# Patient Record
Sex: Female | Born: 1945 | Race: White | Hispanic: No | Smoking: Never smoker
Health system: Southern US, Community
[De-identification: ages and names within clinical notes are randomized; demographics above are authoritative.]

## PROBLEM LIST (undated history)

## (undated) DIAGNOSIS — I10 Essential (primary) hypertension: Secondary | ICD-10-CM

## (undated) DIAGNOSIS — M1990 Unspecified osteoarthritis, unspecified site: Secondary | ICD-10-CM

## (undated) HISTORY — PX: ABDOMINAL HYSTERECTOMY: SHX81

## (undated) HISTORY — PX: APPENDECTOMY: SHX54

## (undated) HISTORY — PX: TONSILLECTOMY: SUR1361

---

## 1999-08-16 ENCOUNTER — Ambulatory Visit (HOSPITAL_COMMUNITY): Admission: RE | Admit: 1999-08-16 | Discharge: 1999-08-16 | Payer: Self-pay | Admitting: Gastroenterology

## 1999-08-17 ENCOUNTER — Ambulatory Visit (HOSPITAL_COMMUNITY): Admission: RE | Admit: 1999-08-17 | Discharge: 1999-08-17 | Payer: Self-pay | Admitting: Gastroenterology

## 1999-08-17 ENCOUNTER — Encounter: Payer: Self-pay | Admitting: Gastroenterology

## 2002-11-12 ENCOUNTER — Other Ambulatory Visit: Admission: RE | Admit: 2002-11-12 | Discharge: 2002-11-12 | Payer: Self-pay | Admitting: Family Medicine

## 2009-12-24 HISTORY — PX: JOINT REPLACEMENT: SHX530

## 2010-07-21 ENCOUNTER — Inpatient Hospital Stay (HOSPITAL_COMMUNITY): Admission: RE | Admit: 2010-07-21 | Discharge: 2010-07-24 | Payer: Self-pay | Admitting: Orthopaedic Surgery

## 2011-01-14 ENCOUNTER — Encounter: Payer: Self-pay | Admitting: Family Medicine

## 2011-03-09 LAB — BASIC METABOLIC PANEL
CO2: 26 mEq/L (ref 19–32)
GFR calc non Af Amer: >60 mL/min (ref >60)
Glucose, Bld: 121 mg/dL — ABNORMAL HIGH (ref 70–99)
Potassium: 3.7 mEq/L (ref 3.5–5.1)
Sodium: 139 mEq/L (ref 135–145)

## 2011-03-09 LAB — PROTIME-INR: INR: 1.2 (ref 0.00–1.49)

## 2011-03-09 LAB — CBC
HCT: 28.4 % — ABNORMAL LOW (ref 36.0–46.0)
Hemoglobin: 9.8 g/dL — ABNORMAL LOW (ref 12.0–15.0)
MCH: 32 pg (ref 26.0–34.0)
MCHC: 34.6 g/dL (ref 30.0–36.0)

## 2011-03-10 LAB — BASIC METABOLIC PANEL
BUN: 24 mg/dL — ABNORMAL HIGH (ref 6–23)
CO2: 25 mEq/L (ref 19–32)
CO2: 26 mEq/L (ref 19–32)
CO2: 29 mEq/L (ref 19–32)
Chloride: 104 mEq/L (ref 96–112)
Chloride: 109 mEq/L (ref 96–112)
Creatinine, Ser: 0.8 mg/dL (ref 0.4–1.2)
GFR calc Af Amer: >60 mL/min (ref >60)
GFR calc Af Amer: >60 mL/min (ref >60)
GFR calc non Af Amer: >60 mL/min (ref >60)
Glucose, Bld: 114 mg/dL — ABNORMAL HIGH (ref 70–99)
Glucose, Bld: 142 mg/dL — ABNORMAL HIGH (ref 70–99)
Potassium: 3.9 mEq/L (ref 3.5–5.1)
Sodium: 135 mEq/L (ref 135–145)
Sodium: 139 mEq/L (ref 135–145)

## 2011-03-10 LAB — CBC
HCT: 28.8 % — ABNORMAL LOW (ref 36.0–46.0)
Hemoglobin: 10 g/dL — ABNORMAL LOW (ref 12.0–15.0)
Hemoglobin: 10.7 g/dL — ABNORMAL LOW (ref 12.0–15.0)
MCH: 31.9 pg (ref 26.0–34.0)
MCH: 32.1 pg (ref 26.0–34.0)
MCHC: 34.5 g/dL (ref 30.0–36.0)
MCHC: 34.6 g/dL (ref 30.0–36.0)
MCV: 92.4 fL (ref 78.0–100.0)
MCV: 93.7 fL (ref 78.0–100.0)
Platelets: 289 10*3/uL (ref 150–400)
RBC: 3.08 MIL/uL — ABNORMAL LOW (ref 3.87–5.11)
RBC: 3.32 MIL/uL — ABNORMAL LOW (ref 3.87–5.11)
RDW: 13.1 % (ref 11.5–15.5)
WBC: 11.1 10*3/uL — ABNORMAL HIGH (ref 4.0–10.5)

## 2011-03-10 LAB — PROTIME-INR
INR: 1.26 (ref 0.00–1.49)
Prothrombin Time: 14.1 seconds (ref 11.6–15.2)
Prothrombin Time: 15.7 seconds — ABNORMAL HIGH (ref 11.6–15.2)

## 2011-03-10 LAB — URINALYSIS, ROUTINE W REFLEX MICROSCOPIC
Glucose, UA: NEGATIVE mg/dL
Nitrite: NEGATIVE
Protein, ur: NEGATIVE mg/dL
Urobilinogen, UA: 0.2 mg/dL (ref 0.0–1.0)

## 2011-03-10 LAB — URINE MICROSCOPIC-ADD ON

## 2011-03-10 LAB — ABO/RH: ABO/RH(D): O POS

## 2017-08-12 ENCOUNTER — Ambulatory Visit (INDEPENDENT_AMBULATORY_CARE_PROVIDER_SITE_OTHER): Payer: Medicare Other

## 2017-08-12 ENCOUNTER — Ambulatory Visit (INDEPENDENT_AMBULATORY_CARE_PROVIDER_SITE_OTHER): Payer: Medicare Other | Admitting: Orthopaedic Surgery

## 2017-08-12 DIAGNOSIS — M1611 Unilateral primary osteoarthritis, right hip: Secondary | ICD-10-CM | POA: Diagnosis not present

## 2017-08-12 DIAGNOSIS — M25551 Pain in right hip: Secondary | ICD-10-CM | POA: Diagnosis not present

## 2017-08-12 NOTE — Progress Notes (Signed)
Office Visit Note   Patient: Lisa [name]           Date of Birth: May 17, 1946           MRN: [id number] Visit Date: 08/12/2017              Requested by: No referring provider defined for this encounter. PCP: Patient, No Pcp Per   Assessment & Plan: Visit Diagnoses:  1. Pain in right hip   2. Unilateral primary osteoarthritis, right hip     Plan: Given the severity of her right hip disease and the detrimental effects this is had on her life, her quality of living, her mobility and due to daily pain she would like to proceed with a right total hip arthroplasty. We went over x-rays in detail and had a long and thorough discussion about hip replacement surgery. Having had this successfully on her left side she does wish proceed in the near future on her right hip and I agree with this. All questions were encouraged and answered.  Follow-Up Instructions: Return for 2 weeks post-op.   Orders:  Orders Placed This Encounter  Procedures  . XR HIP UNILAT W OR W/O PELVIS 1V RIGHT   No orders of the defined types were placed in this encounter.     Procedures: No procedures performed   Clinical Data: No additional findings.   Subjective: No chief complaint on file. The patient is listed as new patient but have actually seen her before. I performed a left total hip arthroplasty on her in 2011. She has done well with that hip and has had no issues with that at all. She has developed right hip pain over the last year with pain in her groin. Is been hurting significantly to her. It is detrimentally affected her activities daily living, her quality of life, and her mobility. She's not been able to take a long exercise walks as he used to. She has tried a cane to offload her hip and anti-inflammatories and this conservative treatment has failed.  HPI  Review of Systems She denies any headache, chest pain, short of breath, fever, chills, nausea, vomiting.  Objective: Vital Signs:  There were no vitals taken for this visit.  Physical Exam She is alert 3 and in no acute distress Ortho Exam Examination of her left hip has fluid range of motion with good internal/external rotation with no blocks to this at all. Her right hip shows significant pain and some limitations with internal and external rotation. Her leg lengths are equal. Specialty Comments:  No specialty comments available.  Imaging: Xr Hip Unilat W Or W/o Pelvis 1v Right  Result Date: 08/12/2017 An AP pelvis and lateral of her right hip show severe end-stage arthritis of the right hip. This was compared to films from 8 years ago. She has loss of the superior lateral joint space. There are sclerotic changes as well as cystic changes. There is significant joint space narrowing in multiple areas. There is periarticular osteophytes as well. She does have a left total hip arthroplasty that appears well seated with no evidence of osteolysis or, getting features.    PMFS History: Patient Active Problem List   Diagnosis Date Noted  . Unilateral primary osteoarthritis, right hip 08/12/2017   No past medical history on file.  No family history on file.  No past surgical history on file. Social History   Occupational History  . Not on file.   Social History Main Topics  .  Smoking status: Not on file  . Smokeless tobacco: Not on file  . Alcohol use Not on file  . Drug use: Unknown  . Sexual activity: Not on file

## 2017-09-18 NOTE — Progress Notes (Signed)
Please place orders in EPIC as patient has a pre-op appointment on 09/23/2017! Thank you! 

## 2017-09-19 ENCOUNTER — Other Ambulatory Visit (INDEPENDENT_AMBULATORY_CARE_PROVIDER_SITE_OTHER): Payer: Self-pay | Admitting: Orthopaedic Surgery

## 2017-09-19 ENCOUNTER — Other Ambulatory Visit (INDEPENDENT_AMBULATORY_CARE_PROVIDER_SITE_OTHER): Payer: Self-pay | Admitting: Physician Assistant

## 2017-09-19 NOTE — Progress Notes (Signed)
Please place orders in EPIC as patient has a pre-op appointment on 09/23/2017! Thank you! 

## 2017-09-20 NOTE — Patient Instructions (Addendum)
Lisa [name]  09/20/2017   Your procedure is scheduled on: 10-04-17  Report to [facility] Main  Entrance Take [place]  [facility] to 3rd floor to  Short Stay Center at 5:15 AM.    Call this number if you have problems the morning of surgery  [phone]   Remember: ONLY 1 PERSON MAY GO WITH YOU TO SHORT STAY TO GET  READY MORNING OF YOUR SURGERY.  Do not eat food or drink liquids :After Midnight.     Take these medicines the morning of surgery with A SIP OF WATER: None                               You may not have any metal on your body including hair pins and              piercings  Do not wear jewelry, make-up, lotions, powders or perfumes, deodorant             Do not wear nail polish.  Do not shave  48 hours prior to surgery.                 Do not bring valuables to the hospital. Lisa [name] IS NOT             RESPONSIBLE   FOR VALUABLES.  Contacts, dentures or bridgework may not be worn into surgery.  Leave suitcase in the car. After surgery it may be brought to your room.                  Please read over the following fact sheets you were given: _____________________________________________________________________             [facility] - Preparing for Surgery Before surgery, you can play an important role.  Because skin is not sterile, your skin needs to be as free of germs as possible.  You can reduce the number of germs on your skin by washing with CHG (chlorahexidine gluconate) soap before surgery.  CHG is an antiseptic cleaner which kills germs and bonds with the skin to continue killing germs even after washing. Please DO NOT use if you have an allergy to CHG or antibacterial soaps.  If your skin becomes reddened/irritated stop using the CHG and inform your nurse when you arrive at Short Stay. Do not shave (including legs and underarms) for at least 48 hours prior to the first CHG shower.  You may shave your face/neck. Please follow these  instructions carefully:  1.  Shower with CHG Soap the night before surgery and the  morning of Surgery.  2.  If you choose to wash your hair, wash your hair first as usual with your  normal  shampoo.  3.  After you shampoo, rinse your hair and body thoroughly to remove the  shampoo.                           4.  Use CHG as you would any other liquid soap.  You can apply chg directly  to the skin and wash                       Gently with a scrungie or clean washcloth.  5.  Apply the CHG Soap to your body  ONLY FROM THE NECK DOWN.   Do not use on face/ open                           Wound or open sores. Avoid contact with eyes, ears mouth and genitals (private parts).                       Wash face,  Genitals (private parts) with your normal soap.             6.  Wash thoroughly, paying special attention to the area where your surgery  will be performed.  7.  Thoroughly rinse your body with warm water from the neck down.  8.  DO NOT shower/wash with your normal soap after using and rinsing off  the CHG Soap.                9.  Pat yourself dry with a clean towel.            10.  Wear clean pajamas.            11.  Place clean sheets on your bed the night of your first shower and do not  sleep with pets. Day of Surgery : Do not apply any lotions/deodorants the morning of surgery.  Please wear clean clothes to the hospital/surgery center.  FAILURE TO FOLLOW THESE INSTRUCTIONS MAY RESULT IN THE CANCELLATION OF YOUR SURGERY PATIENT SIGNATURE_________________________________  NURSE SIGNATURE__________________________________  ________________________________________________________________________   Lisa [name]  An incentive spirometer is a tool that can help keep your lungs clear and active. This tool measures how well you are filling your lungs with each breath. Taking long deep breaths may help reverse or decrease the chance of developing breathing (pulmonary) problems (especially  infection) following:  A long period of time when you are unable to move or be active. BEFORE THE PROCEDURE   If the spirometer includes an indicator to show your best effort, your nurse or respiratory therapist will set it to a desired goal.  If possible, sit up straight or lean slightly forward. Try not to slouch.  Hold the incentive spirometer in an upright position. INSTRUCTIONS FOR USE  1. Sit on the edge of your bed if possible, or sit up as far as you can in bed or on a chair. 2. Hold the incentive spirometer in an upright position. 3. Breathe out normally. 4. Place the mouthpiece in your mouth and seal your lips tightly around it. 5. Breathe in slowly and as deeply as possible, raising the piston or the ball toward the top of the column. 6. Hold your breath for 3-5 seconds or for as long as possible. Allow the piston or ball to fall to the bottom of the column. 7. Remove the mouthpiece from your mouth and breathe out normally. 8. Rest for a few seconds and repeat Steps 1 through 7 at least 10 times every 1-2 hours when you are awake. Take your time and take a few normal breaths between deep breaths. 9. The spirometer may include an indicator to show your best effort. Use the indicator as a goal to work toward during each repetition. 10. After each set of 10 deep breaths, practice coughing to be sure your lungs are clear. If you have an incision (the cut made at the time of surgery), support your incision when coughing by placing a pillow or rolled up towels firmly against it. Once  you are able to get out of bed, walk around indoors and cough well. You may stop using the incentive spirometer when instructed by your caregiver.  RISKS AND COMPLICATIONS  Take your time so you do not get dizzy or light-headed.  If you are in pain, you may need to take or ask for pain medication before doing incentive spirometry. It is harder to take a deep breath if you are having pain. AFTER  USE  Rest and breathe slowly and easily.  It can be helpful to keep track of a log of your progress. Your caregiver can provide you with a simple table to help with this. If you are using the spirometer at home, follow these instructions: [employer] IF:   You are having difficultly using the spirometer.  You have trouble using the spirometer as often as instructed.  Your pain medication is not giving enough relief while using the spirometer.  You develop fever of 100.5 F (38.1 C) or higher. SEEK IMMEDIATE MEDICAL CARE IF:   You cough up bloody sputum that had not been present before.  You develop fever of 102 F (38.9 C) or greater.  You develop worsening pain at or near the incision site. MAKE SURE YOU:   Understand these instructions.  Will watch your condition.  Will get help right away if you are not doing well or get worse. Document Released: 04/22/2007 Document Revised: 03/03/2012 Document Reviewed: 06/23/2007 ExitCare Patient Information 2014 Exit[employer], [place].   ________________________________________________________________________  WHAT IS A BLOOD TRANSFUSION? Blood Transfusion Information  A transfusion is the replacement of blood or some of its parts. Blood is made up of multiple cells which provide different functions.  Red blood cells carry oxygen and are used for blood loss replacement.  White blood cells fight against infection.  Platelets control bleeding.  Plasma helps clot blood.  Other blood products are available for specialized needs, such as hemophilia or other clotting disorders. BEFORE THE TRANSFUSION  Who gives blood for transfusions?   Healthy volunteers who are fully evaluated to make sure their blood is safe. This is blood bank blood. Transfusion therapy is the safest it has ever been in the practice of medicine. Before blood is taken from a donor, a complete history is taken to make sure that person has no history of diseases  nor engages in risky social behavior (examples are intravenous drug use or sexual activity with multiple partners). The donor's travel history is screened to minimize risk of transmitting infections, such as malaria. The donated blood is tested for signs of infectious diseases, such as HIV and hepatitis. The blood is then tested to be sure it is compatible with you in order to minimize the chance of a transfusion reaction. If you or a relative donates blood, this is often done in anticipation of surgery and is not appropriate for emergency situations. It takes many days to process the donated blood. RISKS AND COMPLICATIONS Although transfusion therapy is very safe and saves many lives, the main dangers of transfusion include:   Getting an infectious disease.  Developing a transfusion reaction. This is an allergic reaction to something in the blood you were given. Every precaution is taken to prevent this. The decision to have a blood transfusion has been considered carefully by your caregiver before blood is given. Blood is not given unless the benefits outweigh the risks. AFTER THE TRANSFUSION  Right after receiving a blood transfusion, you will usually feel much better and more energetic. This  is especially true if your red blood cells have gotten low (anemic). The transfusion raises the level of the red blood cells which carry oxygen, and this usually causes an energy increase.  The nurse administering the transfusion will monitor you carefully for complications. HOME CARE INSTRUCTIONS  No special instructions are needed after a transfusion. You may find your energy is better. Speak with your caregiver about any limitations on activity for underlying diseases you may have. SEEK MEDICAL CARE IF:   Your condition is not improving after your transfusion.  You develop redness or irritation at the intravenous (IV) site. SEEK IMMEDIATE MEDICAL CARE IF:  Any of the following symptoms occur over the  next 12 hours:  Shaking chills.  You have a temperature by mouth above 102 F (38.9 C), not controlled by medicine.  Chest, back, or muscle pain.  People around you feel you are not acting correctly or are confused.  Shortness of breath or difficulty breathing.  Dizziness and fainting.  You get a rash or develop hives.  You have a decrease in urine output.  Your urine turns a dark color or changes to pink, red, or brown. Any of the following symptoms occur over the next 10 days:  You have a temperature by mouth above 102 F (38.9 C), not controlled by medicine.  Shortness of breath.  Weakness after normal activity.  The white part of the eye turns yellow (jaundice).  You have a decrease in the amount of urine or are urinating less often.  Your urine turns a dark color or changes to pink, red, or brown. Document Released: 12/07/2000 Document Revised: 03/03/2012 Document Reviewed: 07/26/2008 [facility] Patient Information 2014 [place], [place].  _______________________________________________________________________

## 2017-09-23 ENCOUNTER — Encounter (HOSPITAL_COMMUNITY)
Admission: RE | Admit: 2017-09-23 | Discharge: 2017-09-23 | Disposition: A | Discharge status: Home / Self Care | Payer: Medicare Other | Source: Ambulatory Visit | Attending: Orthopaedic Surgery | Admitting: Orthopaedic Surgery

## 2017-09-23 ENCOUNTER — Encounter (HOSPITAL_COMMUNITY): Payer: Self-pay

## 2017-09-23 ENCOUNTER — Encounter (INDEPENDENT_AMBULATORY_CARE_PROVIDER_SITE_OTHER): Payer: Self-pay

## 2017-09-23 DIAGNOSIS — Z01810 Encounter for preprocedural cardiovascular examination: Secondary | ICD-10-CM | POA: Diagnosis present

## 2017-09-23 DIAGNOSIS — Z01812 Encounter for preprocedural laboratory examination: Secondary | ICD-10-CM | POA: Diagnosis present

## 2017-09-23 HISTORY — DX: Unspecified osteoarthritis, unspecified site: M19.90

## 2017-09-23 HISTORY — DX: Essential (primary) hypertension: I10

## 2017-09-23 LAB — BASIC METABOLIC PANEL
ANION GAP: 9 (ref 5–15)
BUN: 18 mg/dL (ref 6–20)
CHLORIDE: 104 mmol/L (ref 101–111)
CO2: 27 mmol/L (ref 22–32)
CREATININE: 0.78 mg/dL (ref 0.44–1.00)
Calcium: 9.7 mg/dL (ref 8.9–10.3)
GFR calc non Af Amer: >60 mL/min (ref >60)
Glucose, Bld: 99 mg/dL (ref 65–99)
Potassium: 4.6 mmol/L (ref 3.5–5.1)
Sodium: 140 mmol/L (ref 135–145)

## 2017-09-23 LAB — CBC
HEMATOCRIT: 38 % (ref 36.0–46.0)
Hemoglobin: 12.6 g/dL (ref 12.0–15.0)
MCH: 30.2 pg (ref 26.0–34.0)
MCHC: 33.2 g/dL (ref 30.0–36.0)
MCV: 91.1 fL (ref 78.0–100.0)
Platelets: 338 10*3/uL (ref 150–400)
RBC: 4.17 MIL/uL (ref 3.87–5.11)
RDW: 12.6 % (ref 11.5–15.5)
WBC: 7.1 10*3/uL (ref 4.0–10.5)

## 2017-09-23 LAB — SURGICAL PCR SCREEN
MRSA, PCR: NEGATIVE
Staphylococcus aureus: NEGATIVE

## 2017-09-23 LAB — TYPE AND SCREEN
ABO/RH(D): O POS
ANTIBODY SCREEN: NEGATIVE

## 2017-10-03 NOTE — Anesthesia Preprocedure Evaluation (Addendum)
Anesthesia Evaluation  Patient identified by MRN, date of birth, ID band Patient awake    Reviewed: Allergy & Precautions, NPO status , Patient's Chart, lab work & pertinent test results  Airway Mallampati: II  TM Distance: >3 FB Neck ROM: Full    Dental  (+) Dental Advisory Given, Partial Upper, Missing   Pulmonary neg pulmonary ROS,    Pulmonary exam normal breath sounds clear to auscultation       Cardiovascular hypertension, Normal cardiovascular exam Rhythm:Regular Rate:Normal     Neuro/Psych negative neurological ROS  negative psych ROS   GI/Hepatic negative GI ROS, Neg liver ROS,   Endo/Other  negative endocrine ROS  Renal/GU negative Renal ROS  negative genitourinary   Musculoskeletal  (+) Arthritis , Osteoarthritis,    Abdominal   Peds  Hematology negative hematology ROS (+)   Anesthesia Other Findings   Reproductive/Obstetrics                            Anesthesia Physical Anesthesia Plan  ASA: II  Anesthesia Plan: Spinal   Post-op Pain Management:    Induction:   PONV Risk Score and Plan: Treatment may vary due to age or medical condition and Propofol infusion  Airway Management Planned: Natural Airway and Nasal Cannula  Additional Equipment: None  Intra-op Plan:   Post-operative Plan:   Informed Consent: I have reviewed the patients History and Physical, chart, labs and discussed the procedure including the risks, benefits and alternatives for the proposed anesthesia with the patient or authorized representative who has indicated his/her understanding and acceptance.   Dental advisory given  Plan Discussed with: CRNA  Anesthesia Plan Comments:         Anesthesia Quick Evaluation

## 2017-10-04 ENCOUNTER — Encounter (HOSPITAL_COMMUNITY)
Admission: RE | Disposition: A | Discharge status: Home Health | Payer: Self-pay | Source: Ambulatory Visit | Attending: Orthopaedic Surgery

## 2017-10-04 ENCOUNTER — Inpatient Hospital Stay (HOSPITAL_COMMUNITY): Payer: Medicare Other

## 2017-10-04 ENCOUNTER — Encounter (HOSPITAL_COMMUNITY): Payer: Self-pay

## 2017-10-04 ENCOUNTER — Inpatient Hospital Stay (HOSPITAL_COMMUNITY): Payer: Medicare Other | Admitting: Anesthesiology

## 2017-10-04 ENCOUNTER — Inpatient Hospital Stay (HOSPITAL_COMMUNITY)
Admission: RE | Admit: 2017-10-04 | Discharge: 2017-10-07 | DRG: 470 | Disposition: A | Discharge status: Home Health | Payer: Medicare Other | Source: Ambulatory Visit | Attending: Orthopaedic Surgery | Admitting: Orthopaedic Surgery

## 2017-10-04 DIAGNOSIS — Z7982 Long term (current) use of aspirin: Secondary | ICD-10-CM

## 2017-10-04 DIAGNOSIS — M1611 Unilateral primary osteoarthritis, right hip: Secondary | ICD-10-CM | POA: Diagnosis present

## 2017-10-04 DIAGNOSIS — Z8679 Personal history of other diseases of the circulatory system: Secondary | ICD-10-CM | POA: Diagnosis not present

## 2017-10-04 DIAGNOSIS — Z96642 Presence of left artificial hip joint: Secondary | ICD-10-CM | POA: Diagnosis present

## 2017-10-04 DIAGNOSIS — Z419 Encounter for procedure for purposes other than remedying health state, unspecified: Secondary | ICD-10-CM

## 2017-10-04 DIAGNOSIS — Z96641 Presence of right artificial hip joint: Secondary | ICD-10-CM

## 2017-10-04 HISTORY — PX: TOTAL HIP ARTHROPLASTY: SHX124

## 2017-10-04 SURGERY — ARTHROPLASTY, HIP, TOTAL, ANTERIOR APPROACH
Anesthesia: Spinal | Site: Hip | Laterality: Right

## 2017-10-04 MED ORDER — CEFAZOLIN SODIUM-DEXTROSE 2-4 GM/100ML-% IV SOLN
2.0000 g | INTRAVENOUS | Status: AC
  Administered 2017-10-04: 2 g via INTRAVENOUS

## 2017-10-04 MED ORDER — ASPIRIN 81 MG PO CHEW
81.0000 mg | CHEWABLE_TABLET | Freq: Two times a day (BID) | ORAL | Status: DC
  Administered 2017-10-05 – 2017-10-07 (×5): 81 mg via ORAL
  Filled 2017-10-04 (×5): qty 1

## 2017-10-04 MED ORDER — METHOCARBAMOL 500 MG PO TABS
500.0000 mg | ORAL_TABLET | Freq: Four times a day (QID) | ORAL | Status: DC | PRN
  Administered 2017-10-05: 500 mg via ORAL
  Filled 2017-10-04: qty 1

## 2017-10-04 MED ORDER — CEFAZOLIN SODIUM-DEXTROSE 1-4 GM/50ML-% IV SOLN
1.0000 g | Freq: Four times a day (QID) | INTRAVENOUS | Status: AC
  Administered 2017-10-04 (×2): 1 g via INTRAVENOUS
  Filled 2017-10-04 (×2): qty 50

## 2017-10-04 MED ORDER — PHENOL 1.4 % MT LIQD
1.0000 | OROMUCOSAL | Status: DC | PRN
  Filled 2017-10-04: qty 177

## 2017-10-04 MED ORDER — OXYCODONE HCL 5 MG PO TABS
5.0000 mg | ORAL_TABLET | ORAL | Status: DC | PRN
  Administered 2017-10-04: 5 mg via ORAL
  Administered 2017-10-04 – 2017-10-07 (×9): 10 mg via ORAL
  Filled 2017-10-04 (×9): qty 2
  Filled 2017-10-04: qty 1
  Filled 2017-10-04: qty 2

## 2017-10-04 MED ORDER — METOCLOPRAMIDE HCL 5 MG/ML IJ SOLN
5.0000 mg | Freq: Three times a day (TID) | INTRAMUSCULAR | Status: DC | PRN
  Administered 2017-10-04 (×2): 10 mg via INTRAVENOUS
  Filled 2017-10-04 (×2): qty 2

## 2017-10-04 MED ORDER — ONDANSETRON HCL 4 MG/2ML IJ SOLN
INTRAMUSCULAR | Status: AC
  Filled 2017-10-04: qty 2

## 2017-10-04 MED ORDER — ACETAMINOPHEN 325 MG PO TABS
650.0000 mg | ORAL_TABLET | Freq: Four times a day (QID) | ORAL | Status: DC | PRN
  Administered 2017-10-04: 650 mg via ORAL
  Filled 2017-10-04: qty 2

## 2017-10-04 MED ORDER — PROPOFOL 10 MG/ML IV BOLUS
INTRAVENOUS | Status: AC
  Filled 2017-10-04: qty 60

## 2017-10-04 MED ORDER — ACETAMINOPHEN 650 MG RE SUPP: 650.0000 mg | Freq: Four times a day (QID) | RECTAL | Status: DC | PRN

## 2017-10-04 MED ORDER — ZINC SULFATE 220 (50 ZN) MG PO CAPS
220.0000 mg | ORAL_CAPSULE | Freq: Every day | ORAL | Status: DC
  Administered 2017-10-05 – 2017-10-07 (×3): 220 mg via ORAL
  Filled 2017-10-04 (×4): qty 1

## 2017-10-04 MED ORDER — METHOCARBAMOL 1000 MG/10ML IJ SOLN
500.0000 mg | Freq: Four times a day (QID) | INTRAVENOUS | Status: DC | PRN
  Administered 2017-10-04: 500 mg via INTRAVENOUS
  Filled 2017-10-04: qty 550

## 2017-10-04 MED ORDER — MAGNESIUM OXIDE 400 (241.3 MG) MG PO TABS
400.0000 mg | ORAL_TABLET | Freq: Every day | ORAL | Status: DC
  Administered 2017-10-05 – 2017-10-06 (×2): 400 mg via ORAL
  Filled 2017-10-04 (×2): qty 1

## 2017-10-04 MED ORDER — CEFAZOLIN SODIUM-DEXTROSE 2-4 GM/100ML-% IV SOLN
INTRAVENOUS | Status: AC
  Filled 2017-10-04: qty 100

## 2017-10-04 MED ORDER — FENTANYL CITRATE (PF) 100 MCG/2ML IJ SOLN: 25.0000 ug | INTRAMUSCULAR | Status: DC | PRN

## 2017-10-04 MED ORDER — ALUM & MAG HYDROXIDE-SIMETH 200-200-20 MG/5ML PO SUSP: 30.0000 mL | ORAL | Status: DC | PRN

## 2017-10-04 MED ORDER — BUPIVACAINE IN DEXTROSE 0.75-8.25 % IT SOLN
INTRATHECAL | Status: DC | PRN
  Administered 2017-10-04: 1.6 mL via INTRATHECAL

## 2017-10-04 MED ORDER — VITAMIN D3 25 MCG (1000 UNIT) PO TABS
1000.0000 [IU] | ORAL_TABLET | Freq: Every day | ORAL | Status: DC
  Administered 2017-10-05 – 2017-10-07 (×3): 1000 [IU] via ORAL
  Filled 2017-10-04 (×3): qty 1

## 2017-10-04 MED ORDER — PROPOFOL 10 MG/ML IV BOLUS
INTRAVENOUS | Status: AC
  Filled 2017-10-04: qty 20

## 2017-10-04 MED ORDER — TRANEXAMIC ACID 1000 MG/10ML IV SOLN
1000.0000 mg | INTRAVENOUS | Status: AC
  Administered 2017-10-04: 1000 mg via INTRAVENOUS
  Filled 2017-10-04: qty 1100

## 2017-10-04 MED ORDER — POLYETHYLENE GLYCOL 3350 17 G PO PACK: 17.0000 g | PACK | Freq: Every day | ORAL | Status: DC | PRN

## 2017-10-04 MED ORDER — HYDROMORPHONE HCL-NACL 0.5-0.9 MG/ML-% IV SOSY
1.0000 mg | PREFILLED_SYRINGE | INTRAVENOUS | Status: DC | PRN
  Administered 2017-10-04 (×2): 1 mg via INTRAVENOUS
  Filled 2017-10-04 (×2): qty 2

## 2017-10-04 MED ORDER — 0.9 % SODIUM CHLORIDE (POUR BTL) OPTIME
TOPICAL | Status: DC | PRN
Start: 2017-10-04 — End: 2017-10-04
  Administered 2017-10-04: 1000 mL

## 2017-10-04 MED ORDER — SODIUM CHLORIDE 0.9 % IV SOLN
INTRAVENOUS | Status: DC
  Administered 2017-10-04 (×2): via INTRAVENOUS

## 2017-10-04 MED ORDER — VITAMIN A 15000 UNITS PO TABS: 1500.0000 [IU] | ORAL_TABLET | Freq: Every day | ORAL | Status: DC

## 2017-10-04 MED ORDER — PROPOFOL 500 MG/50ML IV EMUL
INTRAVENOUS | Status: DC | PRN
  Administered 2017-10-04: 75 ug/kg/min via INTRAVENOUS

## 2017-10-04 MED ORDER — METOCLOPRAMIDE HCL 5 MG PO TABS: 5.0000 mg | ORAL_TABLET | Freq: Three times a day (TID) | ORAL | Status: DC | PRN

## 2017-10-04 MED ORDER — PROPOFOL 10 MG/ML IV BOLUS
INTRAVENOUS | Status: DC | PRN
  Administered 2017-10-04: 30 mg via INTRAVENOUS

## 2017-10-04 MED ORDER — SODIUM CHLORIDE 0.9 % IR SOLN
Status: DC | PRN
  Administered 2017-10-04: 1000 mL

## 2017-10-04 MED ORDER — CHLORHEXIDINE GLUCONATE 4 % EX LIQD: 60.0000 mL | Freq: Once | CUTANEOUS | Status: DC

## 2017-10-04 MED ORDER — DIPHENHYDRAMINE HCL 12.5 MG/5ML PO ELIX: 12.5000 mg | ORAL_SOLUTION | ORAL | Status: DC | PRN

## 2017-10-04 MED ORDER — DOCUSATE SODIUM 100 MG PO CAPS
100.0000 mg | ORAL_CAPSULE | Freq: Two times a day (BID) | ORAL | Status: DC
  Administered 2017-10-05 – 2017-10-07 (×5): 100 mg via ORAL
  Filled 2017-10-04 (×5): qty 1

## 2017-10-04 MED ORDER — ONDANSETRON HCL 4 MG/2ML IJ SOLN: 4.0000 mg | Freq: Once | INTRAMUSCULAR | Status: DC | PRN

## 2017-10-04 MED ORDER — ONDANSETRON HCL 4 MG PO TABS: 4.0000 mg | ORAL_TABLET | Freq: Four times a day (QID) | ORAL | Status: DC | PRN

## 2017-10-04 MED ORDER — PROPOFOL 10 MG/ML IV BOLUS
INTRAVENOUS | Status: AC
Start: 2017-10-04 — End: 2017-10-04
  Filled 2017-10-04: qty 60

## 2017-10-04 MED ORDER — LACTATED RINGERS IV SOLN
INTRAVENOUS | Status: DC
  Administered 2017-10-04: 1000 mL via INTRAVENOUS

## 2017-10-04 MED ORDER — STERILE WATER FOR IRRIGATION IR SOLN
Status: DC | PRN
Start: 2017-10-04 — End: 2017-10-04
  Administered 2017-10-04: 2000 mL

## 2017-10-04 MED ORDER — ONDANSETRON HCL 4 MG/2ML IJ SOLN
4.0000 mg | Freq: Four times a day (QID) | INTRAMUSCULAR | Status: DC | PRN
  Administered 2017-10-04: 4 mg via INTRAVENOUS
  Filled 2017-10-04: qty 2

## 2017-10-04 MED ORDER — VITAMIN E 180 MG (400 UNIT) PO CAPS
400.0000 [IU] | ORAL_CAPSULE | Freq: Every day | ORAL | Status: DC
  Administered 2017-10-05 – 2017-10-07 (×3): 400 [IU] via ORAL
  Filled 2017-10-04 (×4): qty 1

## 2017-10-04 MED ORDER — DEXAMETHASONE SODIUM PHOSPHATE 10 MG/ML IJ SOLN
INTRAMUSCULAR | Status: AC
  Filled 2017-10-04: qty 1

## 2017-10-04 MED ORDER — MENTHOL 3 MG MT LOZG: 1.0000 | LOZENGE | OROMUCOSAL | Status: DC | PRN

## 2017-10-04 SURGICAL SUPPLY — 34 items
BAG ZIPLOCK 12X15 (MISCELLANEOUS) IMPLANT
BENZOIN TINCTURE PRP APPL 2/3 (GAUZE/BANDAGES/DRESSINGS) ×2 IMPLANT
BLADE SAW SGTL 18X1.27X75 (BLADE) ×2 IMPLANT
CAPT HIP TOTAL 2 ×2 IMPLANT
CELLS DAT CNTRL 66122 CELL SVR (MISCELLANEOUS) ×1 IMPLANT
COVER PERINEAL POST (MISCELLANEOUS) ×2 IMPLANT
COVER SURGICAL LIGHT HANDLE (MISCELLANEOUS) ×2 IMPLANT
DRAPE STERI IOBAN 125X83 (DRAPES) ×2 IMPLANT
DRAPE U-SHAPE 47X51 STRL (DRAPES) ×4 IMPLANT
DRSG AQUACEL AG ADV 3.5X10 (GAUZE/BANDAGES/DRESSINGS) ×2 IMPLANT
DURAPREP 26ML APPLICATOR (WOUND CARE) ×2 IMPLANT
ELECT REM PT RETURN 15FT ADLT (MISCELLANEOUS) ×2 IMPLANT
GAUZE XEROFORM 1X8 LF (GAUZE/BANDAGES/DRESSINGS) IMPLANT
GLOVE BIO SURGEON STRL SZ7.5 (GLOVE) ×2 IMPLANT
GLOVE BIOGEL PI IND STRL 8 (GLOVE) ×2 IMPLANT
GLOVE BIOGEL PI INDICATOR 8 (GLOVE) ×2
GLOVE ECLIPSE 8.0 STRL XLNG CF (GLOVE) ×2 IMPLANT
GOWN STRL REUS W/TWL XL LVL3 (GOWN DISPOSABLE) ×4 IMPLANT
HANDPIECE INTERPULSE COAX TIP (DISPOSABLE) ×1
HOLDER FOLEY CATH W/STRAP (MISCELLANEOUS) ×2 IMPLANT
PACK ANTERIOR HIP CUSTOM (KITS) ×2 IMPLANT
RTRCTR WOUND ALEXIS 18CM MED (MISCELLANEOUS) ×2
SET HNDPC FAN SPRY TIP SCT (DISPOSABLE) ×1 IMPLANT
STAPLER VISISTAT 35W (STAPLE) IMPLANT
STRIP CLOSURE SKIN 1/2X4 (GAUZE/BANDAGES/DRESSINGS) IMPLANT
SUT ETHIBOND NAB CT1 #1 30IN (SUTURE) ×2 IMPLANT
SUT MNCRL AB 4-0 PS2 18 (SUTURE) IMPLANT
SUT VIC AB 0 CT1 36 (SUTURE) ×2 IMPLANT
SUT VIC AB 1 CT1 36 (SUTURE) ×2 IMPLANT
SUT VIC AB 2-0 CT1 27 (SUTURE) ×2
SUT VIC AB 2-0 CT1 TAPERPNT 27 (SUTURE) ×2 IMPLANT
TAPE STRIPS DRAPE STRL (GAUZE/BANDAGES/DRESSINGS) ×2 IMPLANT
TRAY FOLEY W/METER SILVER 16FR (SET/KITS/TRAYS/PACK) ×2 IMPLANT
YANKAUER SUCT BULB TIP 10FT TU (MISCELLANEOUS) ×2 IMPLANT

## 2017-10-04 NOTE — Op Note (Signed)
NAME:  Lisa [name], Lisa [name]                      ACCOUNT NO.:  MEDICAL RECORD NO.:  [id number]  LOCATION:                                 FACILITY:  PHYSICIAN:  Vanita Panda. Magnus Ivan, M.D.DATE OF BIRTH:  DATE OF PROCEDURE:  10/04/2017 DATE OF DISCHARGE:                              OPERATIVE REPORT   PREOPERATIVE DIAGNOSES:  Primary osteoarthritis and degenerative joint disease, right hip.  POSTOPERATIVE DIAGNOSES:  Primary osteoarthritis and degenerative joint disease, right hip.  PROCEDURE:  Right total hip arthroplasty through direct anterior approach.  IMPLANTS:  DePuy Sector Gription acetabular component size 54, size 36 +4 neutral polyethylene liner, size 14 Corail femoral component with standard offset, size 36 -2 metal hip ball.  SURGEON:  Vanita Panda. Magnus Ivan, MD.  ASSISTANT:  Richardean Canal, PA-C.  ANESTHESIA:  Spinal.  BLOOD LOSS:  200 mL.  ANTIBIOTICS:  IV Ancef 2 g.  COMPLICATIONS:  None.  INDICATIONS:  Lisa [name] is a very pleasant 71 year old female, well known to me.  She has debilitating arthritis of her right hip.  She actually had a successful left total hip arthroplasty about 8 years ago, and now her pain has become debilitating on right side.  She has x-ray evidence of severe end-stage arthritis as well.  At this point, she does wish to proceed with a total hip arthroplasty.  She understands our goals are to decrease pain, improve mobility, and overall improved quality of life.  She understands the risks of acute blood loss anemia, nerve and vessel injury, fracture, infection, dislocation, DVT.  PROCEDURE DESCRIPTION:  After informed consent was obtained, appropriate right hip was marked.  She was brought to the operating room, where spinal anesthesia was obtained while she was on her stretcher.  A Foley catheter was placed and she was laid in a supine position on the stretcher.  Traction boots were placed on both of her feet.  Next, she was placed  supine on the Hana fracture table with the perineal post in place and both legs in InLine skeletal traction devices, but no traction applied.  Her right operative hip was prepped and draped with DuraPrep and sterile drapes.  Time-out was called, and she was identified as correct patient and correct right hip.  We then made an incision just inferior and posterior to the anterior superior iliac spine and carried this obliquely down the leg.  We dissected down tensor fascia lata muscle.  The tensor fascia was then divided longitudinally to proceed with a direct anterior approach to the hip.  We identified and cauterized circumflex vessels and identified the hip capsule.  I opened up the hip capsule in an L-type format, finding a large joint effusion and significant arthritic disease in the hip joint itself.  We placed Cobra retractors around the medial and lateral femoral neck and then made our femoral neck cut with an oscillating saw proximal to the lesser trochanter and completed this on osteotome.  I placed a corkscrew guide in the femoral head and removed the femoral head in its entirety and found it to be devoid of cartilage.  We then cleaned the acetabulum and remnants of the acetabular  labrum.  I placed a bent Hohmann over the medial acetabular rim.  I then began reaming under direct visualization from a size 42 reamer in stepwise increments up to a size 54 with all reamers under direct visualization and last reamer under direct fluoroscopy, so we could obtain our depth of reaming, our inclination, and anteversion.  Once we were pleased with this, we placed the real DePuy Sector Gription acetabular component size 54 and a 36 +4 neutral polyethylene liner.  We then externally rotated the leg to 120 degrees extended and abducted it to gain access to the femur.  We placed a Mueller retractor medially and a Hohmann retractor behind the greater trochanter.  We used a box cutting osteotome  to enter femoral canal and a rongeur lateralized and then began broaching from a size 8 broach using the Corail broaching system going up to a size 14.  With a 14 in place, we trialed a standard offset femoral neck with a 36 -2 hip ball due to positioning the cup and based on the preoperative alignment.  She did start shoulder on that side preoperatively.  We reduced the acetabulum.  I reduced the trial implants in the acetabulum.  We were pleased with range of motion, offset, and stability.  We then dislocated the hip and removed the trial components.  We were able to place the real Corail femoral component size 14 with standard offset and real 36 - 2 metal hip ball.  We reduced this in the acetabulum and again, we were pleased with stability.  We then irrigated the soft tissue with normal saline solution using pulsatile lavage.  We closed the joint capsule with interrupted #1 Ethibond suture, followed by running #1 Vicryl in the tensor fascia, 0 Vicryl in the deep tissue, 2-0 Vicryl in the subcutaneous tissue, 4-0 Monocryl subcuticular stitch, and Steri-Strips on the skin.  An Aquacel dressing was applied.  She was taken off the Hana table to the recovery room in stable condition.  All final counts were correct.  There were no complications noted.  Of note, Richardean Canal, PA-C, assisted in the entire case.  His assistance was crucial for facilitating all aspects of this case.     Vanita Panda. Magnus Ivan, M.D.     CYB/MEDQ  D:  10/04/2017  T:  10/04/2017  Job:  [id number]

## 2017-10-04 NOTE — Anesthesia Procedure Notes (Signed)
Spinal  Patient location during procedure: OR Start time: 10/04/2017 7:32 AM End time: 10/04/2017 7:38 AM Reason for block: at surgeon's request Staffing Resident/CRNA: Anne Fu Performed: resident/CRNA  Preanesthetic Checklist Completed: patient identified, site marked, surgical consent, pre-op evaluation, timeout performed, IV checked, risks and benefits discussed and monitors and equipment checked Spinal Block Patient position: sitting Prep: DuraPrep Patient monitoring: heart rate, continuous pulse ox and blood pressure Approach: right paramedian Location: L2-3 Injection technique: single-shot Needle Needle type: Pencan  Needle gauge: 24 G Needle length: 9 cm Assessment Sensory level: T6 Additional Notes Expiration date of kit checked and confirmed. Patient tolerated procedure well, without complications. X 1 attempt with noted clear CSF return. Loss of motor and sensory on exam post injection.

## 2017-10-04 NOTE — Brief Op Note (Signed)
10/04/2017  8:55 AM  PATIENT:  Lisa [name]  71 y.o. female  PRE-OPERATIVE DIAGNOSIS:  severe osteoarthritis right hip  POST-OPERATIVE DIAGNOSIS:  severe osteoarthritis right hip  PROCEDURE:  Procedure(s): RIGHT TOTAL HIP ARTHROPLASTY ANTERIOR APPROACH (Right)  SURGEON:  Surgeon(s) and Role:    Kathryne Hitch, MD - Primary  PHYSICIAN ASSISTANT: Rexene Edison, PA-C  ANESTHESIA:   spinal  EBL:  Total I/O In: -  Out: 400 [Urine:200; Blood:200]  COUNTS:  YES  DICTATION: .Other Dictation: Dictation Number [id number]  PLAN OF CARE: Admit to inpatient   PATIENT DISPOSITION:  PACU - hemodynamically stable.   Delay start of Pharmacological VTE agent (>24hrs) due to surgical blood loss or risk of bleeding: no

## 2017-10-04 NOTE — Transfer of Care (Signed)
Immediate Anesthesia Transfer of Care Note  Patient: Lisa [name]  Procedure(s) Performed: Procedure(s): RIGHT TOTAL HIP ARTHROPLASTY ANTERIOR APPROACH (Right)  Patient Location: PACU  Anesthesia Type:Spinal  Level of Consciousness:  sedated, patient cooperative and responds to stimulation  Airway & Oxygen Therapy:Patient Spontanous Breathing and Patient connected to face mask oxgen  Post-op Assessment:  Report given to PACU RN and Post -op Vital signs reviewed and stable  Post vital signs:  Reviewed and stable  Last Vitals:  Vitals:   10/04/17 0529 10/04/17 0920  BP: (!) 164/74 (!) 141/69  Pulse: 86 70  Resp: 18 14  Temp: 36.8 C   SpO2: 100% 100%    Complications: No apparent anesthesia complications

## 2017-10-04 NOTE — H&P (Signed)
TOTAL HIP ADMISSION H&P  Patient is admitted for right total hip arthroplasty.  Subjective:  Chief Complaint: right hip pain  HPI: Lisa [name], 71 y.o. female, has a history of pain and functional disability in the right hip(s) due to arthritis and patient has failed non-surgical conservative treatments for greater than 12 weeks to include NSAID's and/or analgesics, flexibility and strengthening excercises, use of assistive devices, weight reduction as appropriate and activity modification.  Onset of symptoms was gradual starting 1 years ago with gradually worsening course since that time.The patient noted no past surgery on the right hip(s).  Patient currently rates pain in the right hip at 10 out of 10 with activity. Patient has night pain, worsening of pain with activity and weight bearing, pain that interfers with activities of daily living and pain with passive range of motion. Patient has evidence of subchondral sclerosis, periarticular osteophytes and joint space narrowing by imaging studies. This condition presents safety issues increasing the risk of falls.  There is no current active infection.  Patient Active Problem List   Diagnosis Date Noted  . Unilateral primary osteoarthritis, right hip 08/12/2017   Past Medical History:  Diagnosis Date  . Arthritis    Osteoarthritis  . Hypertension     Past Surgical History:  Procedure Laterality Date  . ABDOMINAL HYSTERECTOMY    . APPENDECTOMY    . JOINT REPLACEMENT  2011   Hip   . TONSILLECTOMY      Prescriptions Prior to Admission  Medication Sig Dispense Refill Last Dose  . aspirin EC 81 MG tablet Take 81 mg by mouth at bedtime.    09/29/2017  . B Complex Vitamins (B COMPLEX PO) Take 1 tablet by mouth daily.   09/29/2017  . Cholecalciferol (VITAMIN D3) 10000 units TABS Take 1,000 Units by mouth daily.   09/29/2017  . Flaxseed, Linseed, (FLAX SEEDS PO) Take 1,400 mg by mouth daily.   09/29/2017  . Glucosamine-Chondroitin  (GLUCOSAMINE CHONDR COMPLEX PO) Take 1,500 mg by mouth daily.   09/29/2017  . ibuprofen (ADVIL,MOTRIN) 200 MG tablet Take 400 mg by mouth 4 (four) times daily as needed for moderate pain.   09/29/2017  . Magnesium 400 MG TABS Take 400 mg by mouth at bedtime.    09/29/2017  . Menaquinone-7 (VITAMIN K2 PO) Take 200 mcg by mouth daily.   09/29/2017  . Misc Natural Products (TART CHERRY ADVANCED PO) Take 13 mg by mouth daily.   09/29/2017  . naproxen sodium (ALEVE) 220 MG tablet Take 660 mg by mouth 4 (four) times daily as needed (pain).   09/29/2017  . Red Yeast Rice Extract (RED YEAST RICE PO) Take 1,200 mg by mouth 2 (two) times daily.    09/29/2017  . Selenium 200 MCG CAPS Take 200 mcg by mouth daily.   09/29/2017  . TURMERIC PO Take 180 mg by mouth daily.   09/29/2017  . Vitamin A 16109 units TABS Take 1,500 Units by mouth daily.   09/29/2017  . vitamin E 400 UNIT capsule Take 400 Units by mouth daily.   09/29/2017  . Zinc 25 MG TABS Take 25 mg by mouth daily.   09/29/2017   No Known Allergies  Social History  Substance Use Topics  . Smoking status: Never Smoker  . Smokeless tobacco: Never Used  . Alcohol use Yes     Comment: occ    History reviewed. No pertinent family history.   Review of Systems  Musculoskeletal: Positive for joint pain.  All other systems reviewed and are negative.   Objective:  Physical Exam  Constitutional: She is oriented to person, place, and time. She appears well-developed and well-nourished.  HENT:  Head: Normocephalic and atraumatic.  Eyes: Pupils are equal, round, and reactive to light. EOM are normal.  Neck: Normal range of motion. Neck supple.  Cardiovascular: Normal rate and regular rhythm.   Respiratory: Effort normal and breath sounds normal.  GI: Soft. Bowel sounds are normal.  Musculoskeletal:       Right hip: She exhibits decreased range of motion, decreased strength, tenderness and bony tenderness.  Neurological: She is alert and oriented to  person, place, and time.  Skin: Skin is warm and dry.  Psychiatric: She has a normal mood and affect.    Vital signs in last 24 hours: Temp:  [98.3 F (36.8 C)] 98.3 F (36.8 C) (10/12 0529) Pulse Rate:  [86] 86 (10/12 0529) Resp:  [18] 18 (10/12 0529) BP: (164)/(74) 164/74 (10/12 0529) SpO2:  [100 %] 100 % (10/12 0529) Weight:  [151 lb (68.5 kg)] 151 lb (68.5 kg) (10/12 0601)  Labs:   Estimated body mass index is 23.65 kg/m as calculated from the following:   Height as of this encounter:  (1.702 m).   Weight as of this encounter: 151 lb (68.5 kg).   Imaging Review Plain radiographs demonstrate severe degenerative joint disease of the right hip(s). The bone quality appears to be excellent for age and reported activity level.  Assessment/Plan:  End stage arthritis, right hip(s)  The patient history, physical examination, clinical judgement of the provider and imaging studies are consistent with end stage degenerative joint disease of the right hip(s) and total hip arthroplasty is deemed medically necessary. The treatment options including medical management, injection therapy, arthroscopy and arthroplasty were discussed at length. The risks and benefits of total hip arthroplasty were presented and reviewed. The risks due to aseptic loosening, infection, stiffness, dislocation/subluxation,  thromboembolic complications and other imponderables were discussed.  The patient acknowledged the explanation, agreed to proceed with the plan and consent was signed. Patient is being admitted for inpatient treatment for surgery, pain control, PT, OT, prophylactic antibiotics, VTE prophylaxis, progressive ambulation and ADL's and discharge planning.The patient is planning to be discharged home with home health services

## 2017-10-04 NOTE — Evaluation (Signed)
Physical Therapy Evaluation Patient Details Name: Lisa [name] MRN: [id number] DOB: 09-Jan-1946 Today's Date: 10/04/2017   History of Present Illness  Pt s/p R THR and with hx of L THR in 2011  Clinical Impression  Pt s/p R THR and presents with decreased R LE strength/ROM and post op pain limiting functional mobility.  Pt should progress to dc home with family assist and HHPT follow up.    Follow Up Recommendations Home health PT    Equipment Recommendations  Rolling walker with 5" wheels    Recommendations for Other Services OT consult     Precautions / Restrictions Precautions Precautions: Fall Restrictions Weight Bearing Restrictions: No Other Position/Activity Restrictions: WBAT      Mobility  Bed Mobility Overal bed mobility: Needs Assistance Bed Mobility: Supine to Sit     Supine to sit: Min assist     General bed mobility comments: cues for sequence and use of L LE to self assist  Transfers Overall transfer level: Needs assistance Equipment used: Rolling walker (2 wheeled) Transfers: Sit to/from Stand Sit to Stand: Min assist         General transfer comment: cues for LE management and use of UEs to self assist  Ambulation/Gait Ambulation/Gait assistance: Min assist Ambulation Distance (Feet): 45 Feet Assistive device: Rolling walker (2 wheeled) Gait Pattern/deviations: Step-to pattern;Step-through pattern;Decreased step length - right;Decreased step length - left;Shuffle;Trunk flexed     General Gait Details: cues for posture, sequence and position from [employer]            Wheelchair Mobility    Modified Rankin (Stroke Patients Only)       Balance                                             Pertinent Vitals/Pain Pain Assessment: 0-10 Pain Score: 8  Pain Location: R hip with WB Pain Descriptors / Indicators: Aching;Guarding;Grimacing;Sore Pain Intervention(s): Limited activity within patient's  tolerance;Monitored during session;Premedicated before session;Ice applied    Home Living Family/patient expects to be discharged to:: Private residence Living Arrangements: Children Available Help at Discharge: Family Type of Home: House Home Access: Stairs to enter   Secretary/administrator of Steps: 1 Home Layout: One level Home Equipment: None      Prior Function Level of Independence: Independent               Hand Dominance        Extremity/Trunk Assessment   Upper Extremity Assessment Upper Extremity Assessment: Overall WFL for tasks assessed    Lower Extremity Assessment Lower Extremity Assessment: RLE deficits/detail    Cervical / Trunk Assessment Cervical / Trunk Assessment: Normal  Communication   Communication: No difficulties  Cognition Arousal/Alertness: Awake/alert Behavior During Therapy: WFL for tasks assessed/performed Overall Cognitive Status: Within Functional Limits for tasks assessed                                        General Comments      Exercises     Assessment/Plan    PT Assessment Patient needs continued PT services  PT Problem List Decreased strength;Decreased range of motion;Decreased activity tolerance;Decreased mobility;Decreased knowledge of use of DME;Pain       PT Treatment Interventions DME instruction;Gait training;Stair training;Functional mobility training;Therapeutic  activities;Therapeutic exercise;Patient/family education    PT Goals (Current goals can be found in the Care Plan section)  Acute Rehab PT Goals Patient Stated Goal: Regain IND and walk with out pain PT Goal Formulation: With patient Time For Goal Achievement: 10/08/17 Potential to Achieve Goals: Good    Frequency 7X/week   Barriers to discharge        Co-evaluation               AM-PAC PT "6 Clicks" Daily Activity  Outcome Measure Difficulty turning over in bed (including adjusting bedclothes, sheets and  blankets)?: A Lot Difficulty moving from lying on back to sitting on the side of the bed? : A Lot Difficulty sitting down on and standing up from a chair with arms (e.g., wheelchair, bedside commode, etc,.)?: A Lot Help needed moving to and from a bed to chair (including a wheelchair)?: A Little Help needed walking in hospital room?: A Little Help needed climbing 3-5 steps with a railing? : A Little 6 Click Score: 15    End of Session Equipment Utilized During Treatment: Gait belt Activity Tolerance: Patient tolerated treatment well;Patient limited by pain Patient left: in chair;with call bell/phone within reach Nurse Communication: Mobility status PT Visit Diagnosis: Difficulty in walking, not elsewhere classified (R26.2)    Time: 8119-1478 PT Time Calculation (min) (ACUTE ONLY): 23 min   Charges:   PT Evaluation $PT Eval Low Complexity: 1 Low PT Treatments $Gait Training: 8-22 mins   PT G Codes:        Pg [phone]   Braiden Rodman 10/04/2017, 6:53 PM

## 2017-10-04 NOTE — Progress Notes (Signed)
PHARMACIST - PHYSICIAN ORDER COMMUNICATION  CONCERNING: P&T Medication Policy on Herbal Medications  DESCRIPTION:  This patient's order for:  Vitamin A  has been noted.  This product(s) is classified as an "herbal" or natural product. Due to a lack of definitive safety studies or FDA approval, nonstandard manufacturing practices, plus the potential risk of unknown drug-drug interactions while on inpatient medications, the Pharmacy and Therapeutics Committee does not permit the use of "herbal" or natural products of this type within [facility].   ACTION TAKEN: The pharmacy department is unable to verify this order at this time and the order has been discontinued. Please reevaluate patient's clinical condition at discharge and address if the herbal or natural product(s) should be resumed at that time.  Adalberto Cole, PharmD, BCPS Pager [phone] 10/04/2017 10:50 AM

## 2017-10-04 NOTE — Anesthesia Postprocedure Evaluation (Signed)
Anesthesia Post Note  Patient: Lisa [name]  Procedure(s) Performed: RIGHT TOTAL HIP ARTHROPLASTY ANTERIOR APPROACH (Right Hip)     Patient location during evaluation: PACU Anesthesia Type: Spinal Level of consciousness: awake and alert Pain management: pain level controlled Vital Signs Assessment: post-procedure vital signs reviewed and stable Respiratory status: spontaneous breathing and respiratory function stable Cardiovascular status: blood pressure returned to baseline and stable Postop Assessment: spinal receding and no apparent nausea or vomiting Anesthetic complications: no    Last Vitals:  Vitals:   10/04/17 0945 10/04/17 1000  BP: (!) 154/68 (!) 149/64  Pulse: 67 64  Resp: 15 20  Temp:  36.5 C  SpO2: 100% 100%    Last Pain:  Vitals:   10/04/17 0945  TempSrc:   PainSc: Asleep    LLE Motor Response: Purposeful movement (10/04/17 1000) LLE Sensation: Increased;Tingling;No pain (10/04/17 1000) RLE Motor Response: Purposeful movement (10/04/17 1000) RLE Sensation: Increased;Tingling;No pain (10/04/17 1000) L Sensory Level: S1-Sole of foot, small toes (10/04/17 1000) R Sensory Level: S1-Sole of foot, small toes (10/04/17 1000)  Beryle Lathe

## 2017-10-05 LAB — BASIC METABOLIC PANEL
Anion gap: 6 (ref 5–15)
BUN: 10 mg/dL (ref 6–20)
CHLORIDE: 107 mmol/L (ref 101–111)
CO2: 27 mmol/L (ref 22–32)
CREATININE: 0.58 mg/dL (ref 0.44–1.00)
Calcium: 8.6 mg/dL — ABNORMAL LOW (ref 8.9–10.3)
GFR calc Af Amer: >60 mL/min (ref >60)
GFR calc non Af Amer: >60 mL/min (ref >60)
Glucose, Bld: 125 mg/dL — ABNORMAL HIGH (ref 65–99)
POTASSIUM: 3.9 mmol/L (ref 3.5–5.1)
Sodium: 140 mmol/L (ref 135–145)

## 2017-10-05 LAB — CBC
HEMATOCRIT: 30.4 % — AB (ref 36.0–46.0)
HEMOGLOBIN: 10.2 g/dL — AB (ref 12.0–15.0)
MCH: 31.3 pg (ref 26.0–34.0)
MCHC: 33.6 g/dL (ref 30.0–36.0)
MCV: 93.3 fL (ref 78.0–100.0)
Platelets: 266 10*3/uL (ref 150–400)
RBC: 3.26 MIL/uL — AB (ref 3.87–5.11)
RDW: 12.7 % (ref 11.5–15.5)
WBC: 10.3 10*3/uL (ref 4.0–10.5)

## 2017-10-05 MED ORDER — ASPIRIN EC 81 MG PO TBEC: 81.0000 mg | DELAYED_RELEASE_TABLET | Freq: Two times a day (BID) | ORAL | 0 refills | Status: AC

## 2017-10-05 MED ORDER — OXYCODONE-ACETAMINOPHEN 5-325 MG PO TABS: 1.0000 | ORAL_TABLET | ORAL | 0 refills | Status: DC | PRN

## 2017-10-05 MED ORDER — METHOCARBAMOL 500 MG PO TABS: 500.0000 mg | ORAL_TABLET | Freq: Four times a day (QID) | ORAL | 0 refills | Status: AC | PRN

## 2017-10-05 NOTE — Discharge Instructions (Signed)

## 2017-10-05 NOTE — Progress Notes (Signed)
Physical Therapy Treatment Patient Details Name: Lisa [name] MRN: [id number] DOB: 07-20-1946 Today's Date: 10/05/2017    History of Present Illness Pt s/p R THR and with hx of L THR in 2011    PT Comments    Pt progressing steadily with mobility and hopeful for dc home tomorrow.   Follow Up Recommendations  Home health PT     Equipment Recommendations  Rolling walker with 5" wheels    Recommendations for Other Services OT consult     Precautions / Restrictions Precautions Precautions: Fall Restrictions Weight Bearing Restrictions: No Other Position/Activity Restrictions: WBAT    Mobility  Bed Mobility Overal bed mobility: Needs Assistance Bed Mobility: Supine to Sit;Sit to Supine     Supine to sit: Min guard Sit to supine: Min assist   General bed mobility comments: cues for sequence and use of L LE to self assist  Transfers Overall transfer level: Needs assistance Equipment used: Rolling walker (2 wheeled) Transfers: Sit to/from Stand Sit to Stand: Supervision         General transfer comment: cues for LE management and use of UEs to self assist  Ambulation/Gait Ambulation/Gait assistance: Min guard;Supervision Ambulation Distance (Feet): 350 Feet Assistive device: Rolling walker (2 wheeled) Gait Pattern/deviations: Step-to pattern;Step-through pattern;Decreased step length - right;Decreased step length - left;Shuffle;Trunk flexed     General Gait Details: cues for posture, sequence and position from RW   Stairs Stairs: Yes   Stair Management: No rails;Step to pattern;Forwards;With walker Number of Stairs: 1 General stair comments: cues for sequence and foot/RW placement  Wheelchair Mobility    Modified Rankin (Stroke Patients Only)       Balance                                            Cognition Arousal/Alertness: Awake/alert Behavior During Therapy: WFL for tasks assessed/performed Overall Cognitive Status:  Within Functional Limits for tasks assessed                                        Exercises      General Comments        Pertinent Vitals/Pain Pain Assessment: 0-10 Pain Score: 6  Pain Location: R hip with WB Pain Descriptors / Indicators: Aching;Sore;Guarding Pain Intervention(s): Limited activity within patient's tolerance;Monitored during session;Premedicated before session;Ice applied    Home Living                      Prior Function            PT Goals (current goals can now be found in the care plan section) Acute Rehab PT Goals Patient Stated Goal: Regain IND and walk with out pain PT Goal Formulation: With patient Time For Goal Achievement: 10/08/17 Potential to Achieve Goals: Good Progress towards PT goals: Progressing toward goals    Frequency    7X/week      PT Plan Current plan remains appropriate    Co-evaluation              AM-PAC PT "6 Clicks" Daily Activity  Outcome Measure  Difficulty turning over in bed (including adjusting bedclothes, sheets and blankets)?: A Lot Difficulty moving from lying on back to sitting on the side of the bed? : A Lot  Difficulty sitting down on and standing up from a chair with arms (e.g., wheelchair, bedside commode, etc,.)?: A Lot Help needed moving to and from a bed to chair (including a wheelchair)?: A Little Help needed walking in hospital room?: A Little Help needed climbing 3-5 steps with a railing? : A Little 6 Click Score: 15    End of Session Equipment Utilized During Treatment: Gait belt Activity Tolerance: Patient tolerated treatment well Patient left: in bed;with call bell/phone within reach Nurse Communication: Mobility status PT Visit Diagnosis: Difficulty in walking, not elsewhere classified (R26.2)     Time: 1610-9604 PT Time Calculation (min) (ACUTE ONLY): 15 min  Charges:  $Gait Training: 8-22 mins                    G Codes:       Pg [phone]  [phone]    Lisa [name] 10/05/2017, 5:25 PM

## 2017-10-05 NOTE — Evaluation (Signed)
Occupational Therapy Evaluation Patient Details Name: Lisa [name] MRN: [id number] DOB: 16-Jun-1946 Today's Date: 10/05/2017    History of Present Illness Pt s/p R THR and with hx of L THR in 2011   Clinical Impression   OT education complete. Husband will A as needed    Follow Up Recommendations  No OT follow up    Equipment Recommendations  None recommended by OT       Precautions / Restrictions Precautions Precautions: Fall Restrictions Weight Bearing Restrictions: No Other Position/Activity Restrictions: WBAT      Mobility Bed Mobility Overal bed mobility: Needs Assistance Bed Mobility: Sit to Supine     Supine to sit: Min assist Sit to supine: Supervision   General bed mobility comments: cues for sequence and use of L LE to self assist  Transfers Overall transfer level: Needs assistance Equipment used: Rolling walker (2 wheeled) Transfers: Sit to/from [employer] Sit to Stand: Supervision Stand pivot transfers: Supervision       General transfer comment: cues for LE management and use of UEs to self assist        ADL either performed or assessed with clinical judgement   ADL Overall ADL's : Needs assistance/impaired Eating/Feeding: Set up;Sitting   Grooming: Sitting;Set up   Upper Body Bathing: Set up;Sitting   Lower Body Bathing: Minimal assistance;Sit to/from stand;Cueing for safety;Cueing for sequencing   Upper Body Dressing : Set up;Minimal assistance;Sitting   Lower Body Dressing: Minimal assistance;Cueing for safety;Cueing for sequencing   Toilet Transfer: Supervision/safety;RW;Ambulation;Cueing for sequencing;Cueing for safety   Toileting- Clothing Manipulation and Hygiene: Supervision/safety;Sit to/from stand;Cueing for safety;Cueing for sequencing     Tub/Shower Transfer Details (indicate cue type and reason): verbalized safety   General ADL Comments: husband will a as needed as well as granddaughter      Vision Patient Visual Report: No change from baseline       Perception     Praxis      Pertinent Vitals/Pain Pain Assessment: 0-10 Pain Score: 3  Pain Location: R hip with WB Pain Descriptors / Indicators: Aching;Sore;Guarding Pain Intervention(s): Limited activity within patient's tolerance;Monitored during session        Extremity/Trunk Assessment         Cervical / Trunk Assessment Cervical / Trunk Assessment: Normal   Communication Communication Communication: No difficulties   Cognition Arousal/Alertness: Awake/alert Behavior During Therapy: WFL for tasks assessed/performed Overall Cognitive Status: Within Functional Limits for tasks assessed                                           Shoulder Instructions      Home Living Family/patient expects to be discharged to:: Private residence Living Arrangements: Children Available Help at Discharge: Family Type of Home: House Home Access: Stairs to enter Secretary/administrator of Steps: 1   Home Layout: One level     Bathroom Shower/Tub: Producer, television/film/video: Standard     Home Equipment: None          Prior Functioning/Environment Level of Independence: Independent                          OT Goals(Current goals can be found in the care plan section) Acute Rehab OT Goals Patient Stated Goal: Regain IND and walk with out pain  OT Frequency:  AM-PAC PT "6 Clicks" Daily Activity     Outcome Measure Help from another person eating meals?: None Help from another person taking care of personal grooming?: None Help from another person toileting, which includes using toliet, bedpan, or urinal?: A Little Help from another person bathing (including washing, rinsing, drying)?: A Little Help from another person to put on and taking off regular upper body clothing?: None Help from another person to put on and taking off regular lower body clothing?:  A Little 6 Click Score: 21   End of Session Equipment Utilized During Treatment: Rolling walker Nurse Communication: Mobility status  Activity Tolerance: Patient tolerated treatment well Patient left: in bed;with bed alarm set                   Time: 1035-1053 OT Time Calculation (min): 18 min Charges:  OT General Charges $OT Visit: 1 Visit OT Evaluation $OT Eval Low Complexity: 1 Low G-Codes:     Lisa [name], OT [phone]  Einar Crow D 10/05/2017, 11:01 AM

## 2017-10-05 NOTE — Progress Notes (Signed)
Physical Therapy Treatment Patient Details Name: Lisa [name] MRN: [id number] DOB: Feb 25, 1946 Today's Date: 10/05/2017    History of Present Illness Pt s/p R THR and with hx of L THR in 2011    PT Comments    Pt with better pain control this am and progressing well with mobility.    Follow Up Recommendations  Home health PT     Equipment Recommendations  Rolling walker with 5" wheels    Recommendations for Other Services OT consult     Precautions / Restrictions Precautions Precautions: Fall Restrictions Weight Bearing Restrictions: No Other Position/Activity Restrictions: WBAT    Mobility  Bed Mobility Overal bed mobility: Needs Assistance Bed Mobility: Supine to Sit     Supine to sit: Min assist     General bed mobility comments: cues for sequence and use of L LE to self assist  Transfers Overall transfer level: Needs assistance Equipment used: Rolling walker (2 wheeled) Transfers: Sit to/from Stand Sit to Stand: Min guard         General transfer comment: cues for LE management and use of UEs to self assist  Ambulation/Gait Ambulation/Gait assistance: Min assist;Min guard Ambulation Distance (Feet): 180 Feet Assistive device: Rolling walker (2 wheeled) Gait Pattern/deviations: Step-to pattern;Step-through pattern;Decreased step length - right;Decreased step length - left;Shuffle;Trunk flexed     General Gait Details: cues for posture, sequence and position from [employer]            Wheelchair Mobility    Modified Rankin (Stroke Patients Only)       Balance                                            Cognition Arousal/Alertness: Awake/alert Behavior During Therapy: WFL for tasks assessed/performed Overall Cognitive Status: Within Functional Limits for tasks assessed                                        Exercises Total Joint Exercises Ankle Circles/Pumps: AROM;Both;15 reps;Supine Quad  Sets: AROM;Both;10 reps;Supine Heel Slides: AAROM;Right;20 reps;Supine Hip ABduction/ADduction: AAROM;Right;15 reps;Supine    General Comments        Pertinent Vitals/Pain Pain Assessment: 0-10 Pain Score: 5  Pain Location: R hip with WB Pain Descriptors / Indicators: Aching;Sore;Guarding Pain Intervention(s): Limited activity within patient's tolerance;Monitored during session;Premedicated before session;Ice applied    Home Living                      Prior Function            PT Goals (current goals can now be found in the care plan section) Acute Rehab PT Goals Patient Stated Goal: Regain IND and walk with out pain PT Goal Formulation: With patient Time For Goal Achievement: 10/08/17 Potential to Achieve Goals: Good Progress towards PT goals: Progressing toward goals    Frequency    7X/week      PT Plan Current plan remains appropriate    Co-evaluation              AM-PAC PT "6 Clicks" Daily Activity  Outcome Measure  Difficulty turning over in bed (including adjusting bedclothes, sheets and blankets)?: A Lot Difficulty moving from lying on back to sitting on the side of the bed? :  A Lot Difficulty sitting down on and standing up from a chair with arms (e.g., wheelchair, bedside commode, etc,.)?: A Lot Help needed moving to and from a bed to chair (including a wheelchair)?: A Little Help needed walking in hospital room?: A Little Help needed climbing 3-5 steps with a railing? : A Little 6 Click Score: 15    End of Session Equipment Utilized During Treatment: Gait belt Activity Tolerance: Patient tolerated treatment well Patient left: in chair;with call bell/phone within reach Nurse Communication: Mobility status PT Visit Diagnosis: Difficulty in walking, not elsewhere classified (R26.2)     Time: 1610-9604 PT Time Calculation (min) (ACUTE ONLY): 24 min  Charges:  $Gait Training: 8-22 mins $Therapeutic Exercise: 8-22 mins                     G Codes:       Pg [phone]    Keona Sheffler 10/05/2017, 9:25 AM

## 2017-10-05 NOTE — Progress Notes (Signed)
Subjective: Patient stable.  Pain controlled.  She is anxious to begin progressing   Objective: Vital signs in last 24 hours: Temp:  [97.3 F (36.3 C)-98.4 F (36.9 C)] 98.2 F (36.8 C) (10/13 0708) Pulse Rate:  [58-78] 72 (10/13 0708) Resp:  [11-20] 15 (10/13 0708) BP: (101-154)/(50-69) 101/55 (10/13 0708) SpO2:  [98 %-100 %] 98 % (10/13 0708)  Intake/Output from previous day: 10/12 0701 - 10/13 0700 In: 3318.8 [P.O.:600; I.V.:2508.8; IV Piggyback:210] Out: 3125 [Urine:2175; Emesis/NG output:750; Blood:200] Intake/Output this shift: Total I/O In: 120 [P.O.:120] Out: 500 [Urine:500]  Exam:  Dorsiflexion/Plantar flexion intact  Labs:  Recent Labs  10/05/17 0455  HGB 10.2*    Recent Labs  10/05/17 0455  WBC 10.3  RBC 3.26*  HCT 30.4*  PLT 266    Recent Labs  10/05/17 0455  NA 140  K 3.9  CL 107  CO2 27  BUN 10  CREATININE 0.58  GLUCOSE 125*  CALCIUM 8.6*   No results for input(s): LABPT, INR in the last 72 hours.  Assessment/Plan: Plan is to mobilize with physical therapy again today.  Pain is controlled.  She may be ready to go Sunday versus Monday.  Labs okay   Burnard Bunting 10/05/2017, 7:35 AM

## 2017-10-06 NOTE — Care Management Note (Signed)
Case Management Note  Patient Details  Name: Lisa [name] MRN: [id number] Date of Birth: 1946/10/19  Subjective/Objective:    S/p R THR                Action/Plan: Discharge Planning: NCM spoke to pt and requesting RW for [name]. Contacted [facility] for RW for [name]. Will be delivered to room prior to dc. Offered choice for [facility]. Pt agreeable to Lisa [name]. States she had Lisa in the past.  PCP ANDY, CAMILLE L   Expected Discharge Date:                 Expected Discharge Plan:  [name]  In-House Referral:  NA  Discharge planning Services  CM Consult  Post Acute Care Choice:  [name] Choice offered to:  Patient  DME Arranged:  Walker rolling DME Agency:  [name]  HH Arranged:  PT HH Agency:  [name] ([facility])  Status of Service:  Completed, signed off  If discussed at [employer], dates discussed:    Additional Comments:  Elliot Cousin, RN 10/06/2017, 5:22 PM

## 2017-10-06 NOTE — Progress Notes (Signed)
Physical Therapy Treatment Patient Details Name: Lisa [name] MRN: [id number] DOB: [birth year] Today's Date: 10/06/2017    History of Present Illness Pt s/p R THR and with hx of L THR in 2011    PT Comments    Pt progressing well with mobility and hopeful for dc home in am.   Follow Up Recommendations  Home health PT     Equipment Recommendations  Rolling walker with 5" wheels    Recommendations for Other Services OT consult     Precautions / Restrictions Precautions Precautions: Fall Restrictions Weight Bearing Restrictions: No Other Position/Activity Restrictions: WBAT    Mobility  Bed Mobility Overal bed mobility: Needs Assistance Bed Mobility: Supine to Sit     Supine to sit: Min guard     General bed mobility comments: min cues for sequence and use of L LE to self assist  Transfers Overall transfer level: Needs assistance Equipment used: Rolling walker (2 wheeled) Transfers: Sit to/from Stand Sit to Stand: Supervision Stand pivot transfers: Supervision       General transfer comment: min cues for LE management and use of UEs to self assist  Ambulation/Gait Ambulation/Gait assistance: Min guard;Supervision Ambulation Distance (Feet): 350 Feet Assistive device: Rolling walker (2 wheeled) Gait Pattern/deviations: Step-to pattern;Step-through pattern;Decreased step length - right;Decreased step length - left;Shuffle;Trunk flexed     General Gait Details: cues for posture and position from [employer]            Wheelchair Mobility    Modified Rankin (Stroke Patients Only)       Balance                                            Cognition Arousal/Alertness: Awake/alert Behavior During Therapy: WFL for tasks assessed/performed Overall Cognitive Status: Within Functional Limits for tasks assessed                                        Exercises Total Joint Exercises Ankle Circles/Pumps:  AROM;Both;15 reps;Supine Quad Sets: AROM;Both;10 reps;Supine Heel Slides: AAROM;Right;20 reps;Supine Hip ABduction/ADduction: AAROM;Right;Supine;20 reps    General Comments        Pertinent Vitals/Pain Pain Assessment: 0-10 Pain Score: 5  Pain Location: R hip with WB Pain Descriptors / Indicators: Aching;Sore;Guarding Pain Intervention(s): Limited activity within patient's tolerance;Monitored during session;Premedicated before session;Ice applied    Home Living                      Prior Function            PT Goals (current goals can now be found in the care plan section) Acute Rehab PT Goals Patient Stated Goal: Regain IND and walk with out pain PT Goal Formulation: With patient Time For Goal Achievement: 10/08/17 Potential to Achieve Goals: Good Progress towards PT goals: Progressing toward goals    Frequency    7X/week      PT Plan Current plan remains appropriate    Co-evaluation              AM-PAC PT "6 Clicks" Daily Activity  Outcome Measure  Difficulty turning over in bed (including adjusting bedclothes, sheets and blankets)?: A Lot Difficulty moving from lying on back to sitting on the side of the bed? :  A Little Difficulty sitting down on and standing up from a chair with arms (e.g., wheelchair, bedside commode, etc,.)?: A Little Help needed moving to and from a bed to chair (including a wheelchair)?: A Little Help needed walking in hospital room?: A Little Help needed climbing 3-5 steps with a railing? : A Little 6 Click Score: 17    End of Session Equipment Utilized During Treatment: Gait belt Activity Tolerance: Patient tolerated treatment well Patient left: in chair;with call bell/phone within reach Nurse Communication: Mobility status PT Visit Diagnosis: Difficulty in walking, not elsewhere classified (R26.2)     Time: 1610-9604 PT Time Calculation (min) (ACUTE ONLY): 23 min  Charges:  $Gait Training: 8-22  mins $Therapeutic Exercise: 8-22 mins                    G Codes:       Pg [phone]    Lisa [name] 10/06/2017, 3:56 PM

## 2017-10-06 NOTE — Progress Notes (Signed)
Subjective: Patient stable.  Moving well in the room   Objective: Vital signs in last 24 hours: Temp:  [98.5 F (36.9 C)-99.6 F (37.6 C)] 99.4 F (37.4 C) (10/14 0513) Pulse Rate:  [77-85] 84 (10/14 0513) Resp:  [16] 16 (10/14 0513) BP: (109-137)/(50-60) 129/60 (10/14 0513) SpO2:  [97 %-100 %] 97 % (10/14 0513)  Intake/Output from previous day: 10/13 0701 - 10/14 0700 In: 1845 [P.O.:1620; I.V.:225] Out: 2975 [Urine:2975] Intake/Output this shift: Total I/O In: -  Out: 650 [Urine:650]  Exam:  Dorsiflexion/Plantar flexion intact  Labs:  Recent Labs  10/05/17 0455  HGB 10.2*    Recent Labs  10/05/17 0455  WBC 10.3  RBC 3.26*  HCT 30.4*  PLT 266    Recent Labs  10/05/17 0455  NA 140  K 3.9  CL 107  CO2 27  BUN 10  CREATININE 0.58  GLUCOSE 125*  CALCIUM 8.6*   No results for input(s): LABPT, INR in the last 72 hours.  Assessment/Plan: Plan at this time is 1 more day of physical therapy today with discharge tomorrow   Burnard Bunting 10/06/2017, 10:53 AM

## 2017-10-07 ENCOUNTER — Encounter (HOSPITAL_COMMUNITY): Payer: Self-pay | Admitting: Orthopaedic Surgery

## 2017-10-07 NOTE — Progress Notes (Signed)
Patient ID: Lisa [name], female   DOB: 1946/02/24, 71 y.o.   MRN: [id number] Doing well.  Can be discharged to home today.

## 2017-10-07 NOTE — Discharge Summary (Signed)
Patient ID: Lisa [name] MRN: [id number] DOB/AGE: 71-05-1946 71 y.o.  Admit date: 10/04/2017 Discharge date: 10/07/2017  Admission Diagnoses:  Principal Problem:   Unilateral primary osteoarthritis, right hip Active Problems:   Status post total replacement of right hip   Discharge Diagnoses:  Same  Past Medical History:  Diagnosis Date  . Arthritis    Osteoarthritis  . Hypertension     Surgeries: Procedure(s): RIGHT TOTAL HIP ARTHROPLASTY ANTERIOR APPROACH on 10/04/2017   Consultants:   Discharged Condition: Improved  Hospital Course: Lisa [name] is an 71 y.o. female who was admitted 10/04/2017 for operative treatment ofUnilateral primary osteoarthritis, right hip. Patient has severe unremitting pain that affects sleep, daily activities, and work/hobbies. After pre-op clearance the patient was taken to the operating room on 10/04/2017 and underwent  Procedure(s): RIGHT TOTAL HIP ARTHROPLASTY ANTERIOR APPROACH.    Patient was given perioperative antibiotics: Anti-infectives    Start     Dose/Rate Route Frequency Ordered Stop   10/04/17 1400  ceFAZolin (ANCEF) IVPB 1 g/50 mL premix     1 g 100 mL/hr over 30 Minutes Intravenous Every 6 hours 10/04/17 1037 10/04/17 2000   10/04/17 0534  ceFAZolin (ANCEF) 2-4 GM/100ML-% IVPB    Comments:  Curlene Dolphin   : cabinet override      10/04/17 0534 10/04/17 0755   10/04/17 0527  ceFAZolin (ANCEF) IVPB 2g/100 mL premix     2 g 200 mL/hr over 30 Minutes Intravenous On call to O.R. 10/04/17 4098 10/04/17 0815       Patient was given sequential compression devices, early ambulation, and chemoprophylaxis to prevent DVT.  Patient benefited maximally from hospital stay and there were no complications.    Recent vital signs: Patient Vitals for the past 24 hrs:  BP Temp Temp src Pulse Resp SpO2  10/07/17 0424 (!) 125/56 98.3 F (36.8 C) Oral 75 17 99 %  10/06/17 2043 133/62 98.7 F (37.1 C) Oral 83 17 96 %  10/06/17 1439  138/81 99.3 F (37.4 C) Oral 90 - 98 %     Recent laboratory studies:  Recent Labs  10/05/17 0455  WBC 10.3  HGB 10.2*  HCT 30.4*  PLT 266  NA 140  K 3.9  CL 107  CO2 27  BUN 10  CREATININE 0.58  GLUCOSE 125*  CALCIUM 8.6*     Discharge Medications:   Allergies as of 10/07/2017   No Known Allergies     Medication List    TAKE these medications   ALEVE 220 MG tablet Generic drug:  naproxen sodium Take 660 mg by mouth 4 (four) times daily as needed (pain).   aspirin EC 81 MG tablet Take 1 tablet (81 mg total) by mouth 2 (two) times daily after a meal. What changed:  when to take this   B COMPLEX PO Take 1 tablet by mouth daily.   FLAX SEEDS PO Take 1,400 mg by mouth daily.   GLUCOSAMINE CHONDR COMPLEX PO Take 1,500 mg by mouth daily.   ibuprofen 200 MG tablet Commonly known as:  ADVIL,MOTRIN Take 400 mg by mouth 4 (four) times daily as needed for moderate pain.   Magnesium 400 MG Tabs Take 400 mg by mouth at bedtime.   methocarbamol 500 MG tablet Commonly known as:  ROBAXIN Take 1 tablet (500 mg total) by mouth every 6 (six) hours as needed for muscle spasms.   oxyCODONE-acetaminophen 5-325 MG tablet Commonly known as:  ROXICET Take 1-2 tablets by mouth every  4 (four) hours as needed.   RED YEAST RICE PO Take 1,200 mg by mouth 2 (two) times daily.   Selenium 200 MCG Caps Take 200 mcg by mouth daily.   TART CHERRY ADVANCED PO Take 13 mg by mouth daily.   TURMERIC PO Take 180 mg by mouth daily.   Vitamin A 82956 units Tabs Take 1,500 Units by mouth daily.   Vitamin D3 10000 units Tabs Take 1,000 Units by mouth daily.   vitamin E 400 UNIT capsule Take 400 Units by mouth daily.   VITAMIN K2 PO Take 200 mcg by mouth daily.   Zinc 25 MG Tabs Take 25 mg by mouth daily.            Durable Medical Equipment        Start     Ordered   10/04/17 1038  DME 3 n 1  Once     10/04/17 1037   10/04/17 1038  DME Walker rolling  Once     Question:  Patient needs a walker to treat with the following condition  Answer:  Status post total replacement of right hip   10/04/17 1037      Diagnostic Studies: Dg Pelvis Portable  Result Date: 10/04/2017 CLINICAL DATA:  Status post right total hip replacement EXAM: PORTABLE PELVIS 1-2 VIEWS COMPARISON:  Intraoperative fluoroscopic images dated 10/04/2017 FINDINGS: Right total hip arthroplasty in satisfactory position. Associated soft tissue gas. Status post left total hip arthroplasty. No fracture or dislocation is seen. Visualized bony pelvis appears intact. IMPRESSION: Right total hip arthroplasty in satisfactory position. Electronically Signed   By: Charline Bills M.D.   On: 10/04/2017 09:40   Dg C-arm 1-60 Min-no Report  Result Date: 10/04/2017 Fluoroscopy was utilized by the requesting physician.  No radiographic interpretation.   Dg Hip Operative Unilat With Pelvis Right  Result Date: 10/04/2017 CLINICAL DATA:  Right hip replacement EXAM: OPERATIVE right HIP (WITH PELVIS IF PERFORMED) 9 VIEWS TECHNIQUE: Fluoroscopic spot image(s) were submitted for interpretation post-operatively. COMPARISON:  08/12/2017 FINDINGS: Multiple intraoperative spot images demonstrate changes of right hip replacement. Normal AP alignment. No hardware bony complicating feature. Remote changes of left hip replacement. IMPRESSION: Right hip replacement.  No visible complicating feature. Electronically Signed   By: Charlett Nose M.D.   On: 10/04/2017 09:12    Disposition: to home  Discharge Instructions    Discharge patient    Complete by:  As directed    Discharge disposition:  01-Home or Self Care   Discharge patient date:  10/07/2017      Follow-up Information    Kathryne Hitch, MD Follow up in 2 week(s).   Specialty:  Orthopedic Surgery Contact information: [address] [place] [address] [phone]        [facility] Follow up.   Specialty:  Home  Health Services Why:  Home Health Physical Therapy-agency will call to arrange initial visit Contact information: [address] [place] [address] [place] [address] [phone]        [facility] - Dme Follow up.   Why:   deliver Rolling Walker to room prior to discharge Contact information: [address] [place] [address] [phone]            Signed: Kathryne Hitch 10/07/2017, 7:16 AM

## 2017-10-09 ENCOUNTER — Telehealth (INDEPENDENT_AMBULATORY_CARE_PROVIDER_SITE_OTHER): Payer: Self-pay | Admitting: Orthopaedic Surgery

## 2017-10-09 NOTE — Telephone Encounter (Signed)
Verbal Order  Rolm Galarik ( Physical therapist) Contact info [phone]   Three times a week for one week  Twice a week for one week

## 2017-10-09 NOTE — Telephone Encounter (Signed)
Verbal order left on VM  

## 2017-10-17 ENCOUNTER — Inpatient Hospital Stay (INDEPENDENT_AMBULATORY_CARE_PROVIDER_SITE_OTHER): Payer: Medicare Other | Admitting: Orthopaedic Surgery

## 2017-10-18 ENCOUNTER — Encounter (INDEPENDENT_AMBULATORY_CARE_PROVIDER_SITE_OTHER): Payer: Self-pay | Admitting: Orthopaedic Surgery

## 2017-10-18 ENCOUNTER — Ambulatory Visit (INDEPENDENT_AMBULATORY_CARE_PROVIDER_SITE_OTHER): Payer: Medicare Other | Admitting: Orthopaedic Surgery

## 2017-10-18 DIAGNOSIS — Z96641 Presence of right artificial hip joint: Secondary | ICD-10-CM

## 2017-10-18 NOTE — Progress Notes (Signed)
The patient is now 2 weeks status post a right total hip arthroplasty through direct anterior approach.  She is already driving.  She is Artie off pain medication and says she is doing great.  On exam she does have a moderate seroma and I drained 60 cc of fluid off of her incision area.  There is no evidence of infection at all.  The Steri-Strips are removed and the insertion was placed.  Her leg lengths are exactly equal.  This point she will continue to increase her activities.  We will see her back in a month see how she is doing overall.  All questions were encouraged and answered.  If she needs a seroma drained again she will let us know.

## 2017-11-20 ENCOUNTER — Ambulatory Visit (INDEPENDENT_AMBULATORY_CARE_PROVIDER_SITE_OTHER): Payer: Medicare Other | Admitting: Orthopaedic Surgery

## 2017-11-25 ENCOUNTER — Ambulatory Visit (INDEPENDENT_AMBULATORY_CARE_PROVIDER_SITE_OTHER): Payer: Medicare Other | Admitting: Orthopaedic Surgery

## 2017-11-25 ENCOUNTER — Encounter (INDEPENDENT_AMBULATORY_CARE_PROVIDER_SITE_OTHER): Payer: Self-pay | Admitting: Orthopaedic Surgery

## 2017-11-25 DIAGNOSIS — Z96641 Presence of right artificial hip joint: Secondary | ICD-10-CM

## 2017-11-25 NOTE — Progress Notes (Signed)
Patient is here for a quick visit today.  She is only about 7 weeks out from a right total hip arthroplasty direct anterior approach.  We actually did a left total hip arthroplasty on her about 8 years ago.  She is doing well she is walking without assistive device and has no complaints at all.  She is walking without a limp.  Her leg lengths are equal.  Tolerates me putting both hips through full motion.  At this point we do not really need to see her back for 6 months.  I would like a low AP pelvis at that visit.

## 2020-04-22 ENCOUNTER — Other Ambulatory Visit: Payer: Self-pay

## 2020-04-22 ENCOUNTER — Ambulatory Visit (INDEPENDENT_AMBULATORY_CARE_PROVIDER_SITE_OTHER): Payer: Medicare Other | Admitting: Orthopedic Surgery

## 2020-04-22 ENCOUNTER — Ambulatory Visit (INDEPENDENT_AMBULATORY_CARE_PROVIDER_SITE_OTHER): Payer: Medicare Other

## 2020-04-22 ENCOUNTER — Encounter: Payer: Self-pay | Admitting: Orthopedic Surgery

## 2020-04-22 DIAGNOSIS — M79601 Pain in right arm: Secondary | ICD-10-CM | POA: Diagnosis not present

## 2020-04-22 DIAGNOSIS — R29898 Other symptoms and signs involving the musculoskeletal system: Secondary | ICD-10-CM | POA: Diagnosis not present

## 2020-04-22 NOTE — Progress Notes (Signed)
Office Visit Note   Patient: Lisa [name]           Date of Birth: 1946-06-12           MRN: [id number] Visit Date: 04/22/2020 Requested by: No referring provider defined for this encounter. PCP: Patient, No Pcp Per  Subjective: Chief Complaint  Patient presents with  . Right Shoulder - Pain    HPI: Lisa [name] is a 74 y.o. female who presents to the office complaining of right shoulder pain.  Patient notes that she has had pain since December 2020.  She notes gradual onset of pain without a discrete injury.  She swims 3 times a week but she cannot swim freestyle anymore, only breaststroke.  She has difficulty lifting her right arm above her head.  She notes lateral sided right shoulder pain that radiates to her neck and down to her elbow.  She denies any numbness or tingling aside from one episode of numbness and tingling that affected only her right hand.  She has been taking Aleve with some relief.  She does note some grinding and pain that wakes her up at night.  She has tried a home exercise program without relief.  She has had success with physical therapy with a similar issue in her shoulder about 5 years ago.  She has never had an MRI of the right shoulder.  She has never had right shoulder surgery..                ROS:  All systems reviewed are negative as they relate to the chief complaint within the history of present illness.  Patient denies fevers or chills.  Assessment & Plan: Visit Diagnoses:  1. Right arm pain   2. Shoulder weakness     Plan: Patient is a 74 year old female who presents complaining of right shoulder pain since December 2020.  She denies any discrete injury but she has had worsening symptoms with weakness of the right shoulder and now she can only lift her arm up to about 60 degrees of abduction.  She does have passive range of motion greater than this to about 90 degrees abduction.  On exam she has some anterior crepitus that is felt with passive range  of motion of the shoulder.  There is no loss of passive external rotation.  Pain is waking her up at night and a home exercise program has not provided relief.  Radiographs taken today reveal degeneration in the cervical spine as well as some loss of acromiohumeral interval of the right shoulder.  Ordered MRI of the right shoulder with arthrogram to evaluate for rotator cuff tear versus frozen shoulder.  Patient will follow up after MRI to review results.  Follow-Up Instructions: No follow-ups on file.   Orders:  Orders Placed This Encounter  Procedures  . XR Shoulder Right  . XR Cervical Spine 2 or 3 views  . MR SHOULDER RIGHT W CONTRAST  . Arthrogram   No orders of the defined types were placed in this encounter.     Procedures: No procedures performed   Clinical Data: No additional findings.  Objective: Vital Signs: There were no vitals taken for this visit.  Physical Exam:  Constitutional: Patient appears well-developed HEENT:  Head: Normocephalic Eyes:EOM are normal Neck: Normal range of motion Cardiovascular: Normal rate Pulmonary/chest: Effort normal Neurologic: Patient is alert Skin: Skin is warm Psychiatric: Patient has normal mood and affect  Ortho Exam:  Right shoulder Exam Active forward  flexion and abduction to 60 to 70 degrees.  Passive abduction to 90 degrees.  Passive forward flexion to 100 degrees. Slight loss of external rotation relative to the contralateral side.  Loss of internal rotation compared with contralateral side. Mild tenderness to palpation over the bicipital groove Anterior crepitus felt with passive range of motion of the right shoulder. No TTP over the [facility] joint Good subscapularis, supraspinatus, and infraspinatus strength Negative Hawkins impingement 5/5 grip strength, forearm pronation/supination, and bicep strength No tenderness to palpation throughout the axial cervical spine.  Specialty Comments:  No specialty comments  available.  Imaging: No results found.   PMFS History: Patient Active Problem List   Diagnosis Date Noted  . Status post total replacement of right hip 10/04/2017  . Unilateral primary osteoarthritis, right hip 08/12/2017   Past Medical History:  Diagnosis Date  . Arthritis    Osteoarthritis  . Hypertension     No family history on file.  Past Surgical History:  Procedure Laterality Date  . ABDOMINAL HYSTERECTOMY    . APPENDECTOMY    . JOINT REPLACEMENT  2011   Hip   . TONSILLECTOMY    . TOTAL HIP ARTHROPLASTY Right 10/04/2017   Procedure: RIGHT TOTAL HIP ARTHROPLASTY ANTERIOR APPROACH;  Surgeon: Mcarthur Rossetti, MD;  Location: [facility]S;  Service: Orthopedics;  Laterality: Right;   Social History   Occupational History  . Not on file  Tobacco Use  . Smoking status: Never Smoker  . Smokeless tobacco: Never Used  Substance and Sexual Activity  . Alcohol use: Yes    Comment: occ  . Drug use: No  . Sexual activity: Not on file

## 2020-04-23 ENCOUNTER — Encounter: Payer: Self-pay | Admitting: Orthopedic Surgery

## 2020-04-29 ENCOUNTER — Telehealth: Payer: Self-pay | Admitting: Orthopedic Surgery

## 2020-04-29 NOTE — Telephone Encounter (Signed)
Called patient left voicemail message to return call to schedule an appointment for MRI review with Dr August Saucer. The MRI is scheduled for 05/16/2020

## 2020-05-16 ENCOUNTER — Ambulatory Visit
Admission: RE | Admit: 2020-05-16 | Discharge: 2020-05-16 | Disposition: A | Discharge status: Home / Self Care | Payer: Medicare Other | Source: Ambulatory Visit | Attending: Orthopedic Surgery | Admitting: Orthopedic Surgery

## 2020-05-16 DIAGNOSIS — R29898 Other symptoms and signs involving the musculoskeletal system: Secondary | ICD-10-CM

## 2020-05-16 DIAGNOSIS — M79601 Pain in right arm: Secondary | ICD-10-CM

## 2020-05-16 MED ORDER — IOPAMIDOL (ISOVUE-M 200) INJECTION 41%
13.0000 mL | Freq: Once | INTRAMUSCULAR | Status: AC
  Administered 2020-05-16: 13 mL via INTRA_ARTICULAR

## 2020-05-19 ENCOUNTER — Ambulatory Visit: Payer: Medicare Other | Admitting: Orthopaedic Surgery

## 2020-05-25 ENCOUNTER — Other Ambulatory Visit: Payer: Self-pay

## 2020-05-25 ENCOUNTER — Ambulatory Visit (INDEPENDENT_AMBULATORY_CARE_PROVIDER_SITE_OTHER): Payer: Medicare Other | Admitting: Orthopedic Surgery

## 2020-05-25 DIAGNOSIS — S46011D Strain of muscle(s) and tendon(s) of the rotator cuff of right shoulder, subsequent encounter: Secondary | ICD-10-CM

## 2020-05-26 ENCOUNTER — Encounter: Payer: Self-pay | Admitting: Orthopedic Surgery

## 2020-05-26 NOTE — Progress Notes (Signed)
Office Visit Note   Patient: Lisa [name]           Date of Birth: 04-26-1946           MRN: [id number] Visit Date: 05/25/2020 Requested by: No referring provider defined for this encounter. PCP: Patient, No Pcp Per  Subjective: Chief Complaint  Patient presents with  . Follow-up    HPI: Clora is a patient since have seen her she is had an MRI scan which is reviewed.  She has a supraspinatus tear as well as upper subscap tear.  Biceps tendon remains in the groove.  She also has a partial-thickness infraspinatus tear.  She likes to swim 3 days a week.  It has been hard for her to do freestyle.  Some days are better than others.  In general her pain is a little bit better than she is and has been at the prior clinic visit.  She has been doing exercises.  She is fairly adamant about avoiding surgery.  However the rotator cuff tear does appear repairable at this time.  That may not be the case 6 or 8 months from now.              ROS: All systems reviewed are negative as they relate to the chief complaint within the history of present illness.  Patient denies  fevers or chills.   Assessment & Plan: Visit Diagnoses:  1. Traumatic complete tear of right rotator cuff, subsequent encounter     Plan: Impression is right shoulder rotator cuff tear which is primarily anterior superior.  Still has fairly reasonable function in that right shoulder.  Wants to hold off on surgical intervention for now.  She is going to avoid a lot of lifting out in front of her body.  Follow-up with [place] as needed.  Follow-Up Instructions: No follow-ups on file.   Orders:  No orders of the defined types were placed in this encounter.  No orders of the defined types were placed in this encounter.     Procedures: No procedures performed   Clinical Data: No additional findings.  Objective: Vital Signs: There were no vitals taken for this visit.  Physical Exam:   Constitutional: Patient appears  well-developed HEENT:  Head: Normocephalic Eyes:EOM are normal Neck: Normal range of motion Cardiovascular: Normal rate Pulmonary/chest: Effort normal Neurologic: Patient is alert Skin: Skin is warm Psychiatric: Patient has normal mood and affect    Ortho Exam: Ortho exam demonstrates some weakness to supraspinatus testing on the right compared to the left.  Subscap strength pretty symmetric bilaterally.  She does have a little bit of coarseness with internal/external rotation on the right-hand side.  No discrete AC joint tenderness to direct palpation.  No restriction of passive external rotation of 15 degrees of abduction.  Specialty Comments:  No specialty comments available.  Imaging: No results found.   PMFS History: Patient Active Problem List   Diagnosis Date Noted  . Status post total replacement of right hip 10/04/2017  . Unilateral primary osteoarthritis, right hip 08/12/2017   Past Medical History:  Diagnosis Date  . Arthritis    Osteoarthritis  . Hypertension     No family history on file.  Past Surgical History:  Procedure Laterality Date  . ABDOMINAL HYSTERECTOMY    . APPENDECTOMY    . JOINT REPLACEMENT  2011   Hip   . TONSILLECTOMY    . TOTAL HIP ARTHROPLASTY Right 10/04/2017   Procedure: RIGHT TOTAL HIP ARTHROPLASTY  ANTERIOR APPROACH;  Surgeon: Kathryne Hitch, MD;  Location: [facility];  Service: Orthopedics;  Laterality: Right;   Social History   Occupational History  . Not on file  Tobacco Use  . Smoking status: Never Smoker  . Smokeless tobacco: Never Used  Substance and Sexual Activity  . Alcohol use: Yes    Comment: occ  . Drug use: No  . Sexual activity: Not on file

## 2021-12-18 IMAGING — MR MR SHOULDER*R* W/CM
6 series · 40 of 40 positions shown · IV contrast (agent unspecified)
Comparison: None.

CLINICAL DATA: Limited range of motion since [REDACTED], moving
shoulder

EXAM:
MR ARTHROGRAM OF THE right SHOULDER
TECHNIQUE: Multiplanar, multisequence MR imaging of the right shoulder was
performed following the administration of intra-articular contrast.
CONTRAST:  See Injection Documentation.

[Series 3: T1 fat-sat · axial · 4.0mm · 0.27mm/px · z∈[-45,+53]mm · 8 of 21 slices shown (1 of 4)]
[im 1/21]
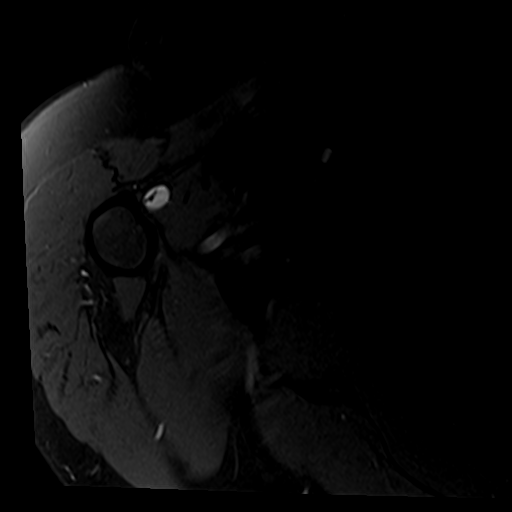
[im 3/21]
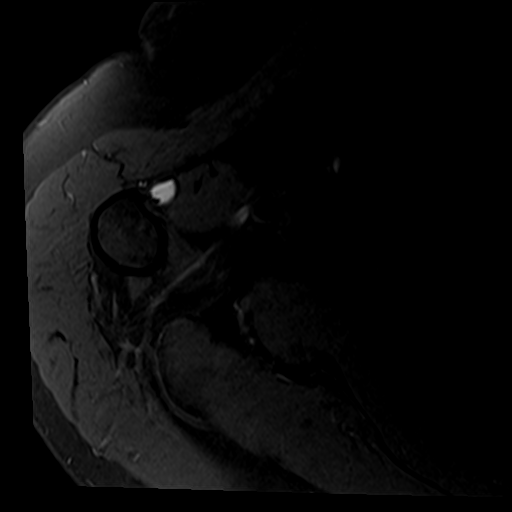
[im 6/21]
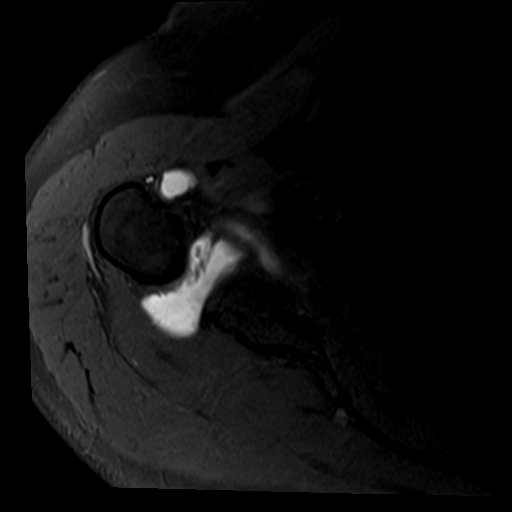
[im 9/21]
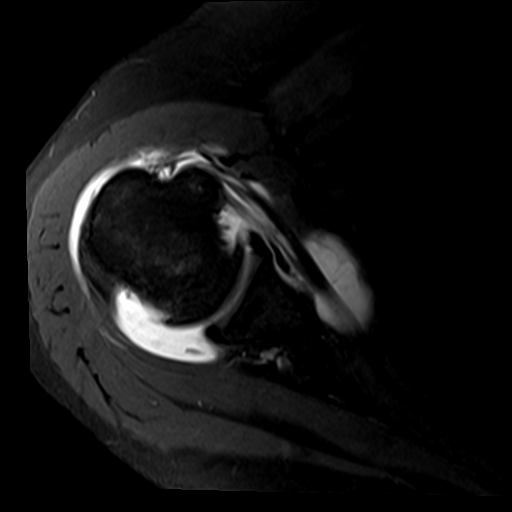
[im 12/21]
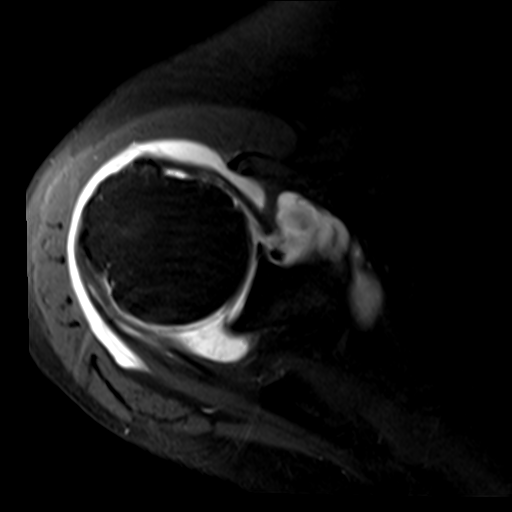
[im 15/21]
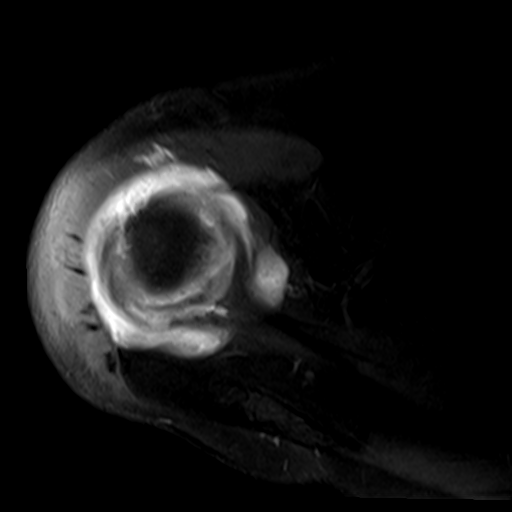
[im 18/21]
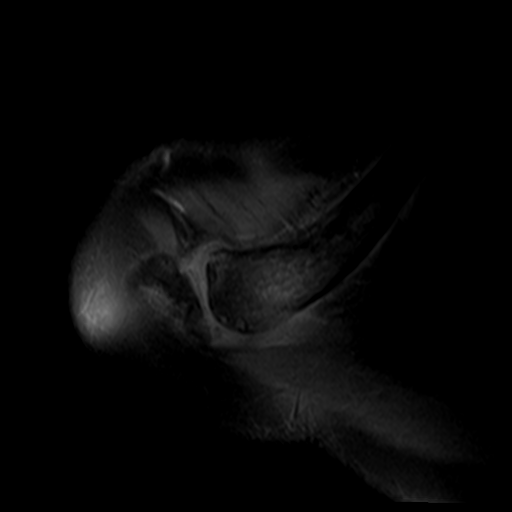
[im 21/21]
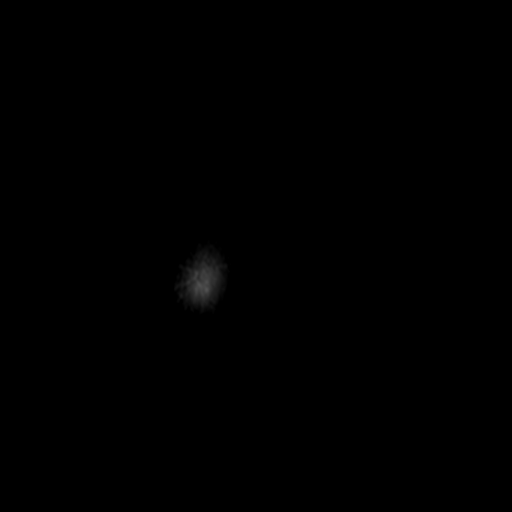

[Series 4: T2 fat-sat · oblique · 4.0mm · 0.55mm/px · 7 of 19 slices shown (1 of 2)]
[im 1/19]
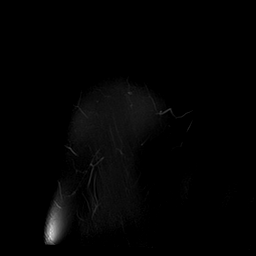
[im 4/19]
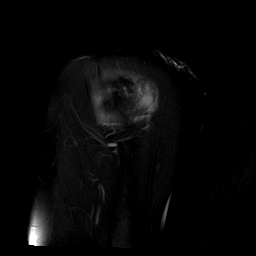
[im 7/19]
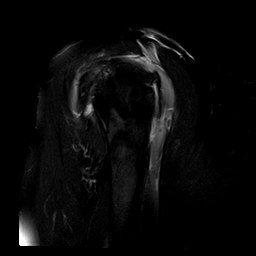
[im 10/19]
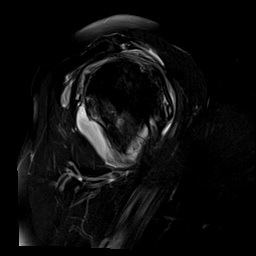
[im 13/19]
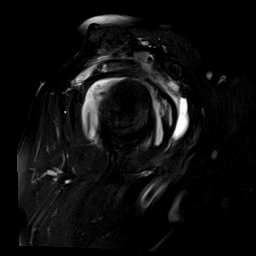
[im 16/19]
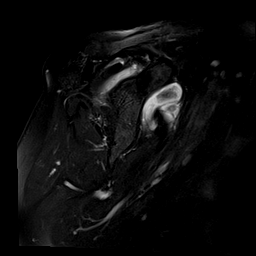
[im 19/19]
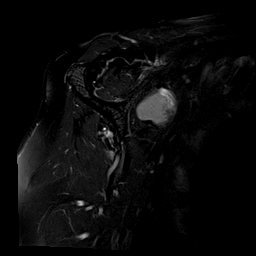

[Series 5: T1 fat-sat · sagittal · 4.0mm · 0.55mm/px · 6 of 17 slices shown (2 of 4)]
[im 1/17]
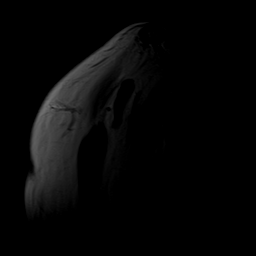
[im 4/17]
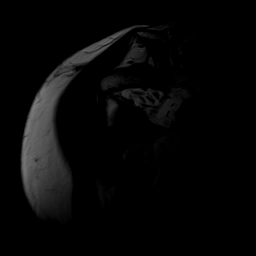
[im 7/17]
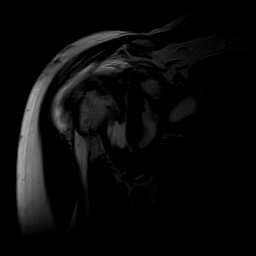
[im 10/17]
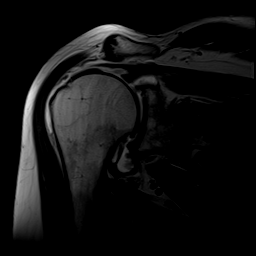
[im 13/17]
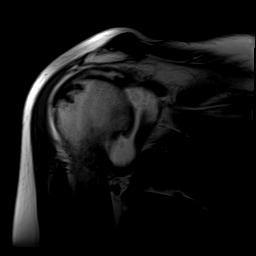
[im 17/17]
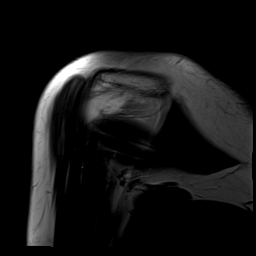

[Series 6: T1 fat-sat · sagittal · 4.0mm · 0.55mm/px · 6 of 17 slices shown (3 of 4)]
[im 1/17]
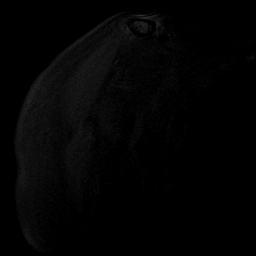
[im 4/17]
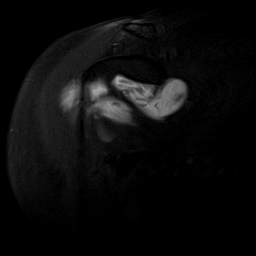
[im 7/17]
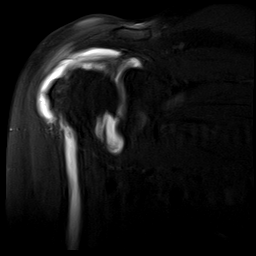
[im 10/17]
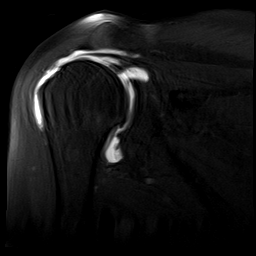
[im 13/17]
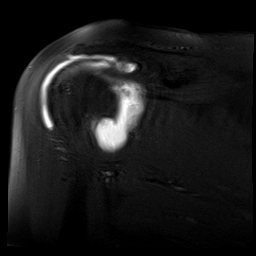
[im 17/17]
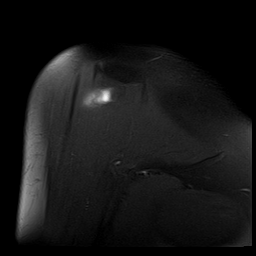

[Series 7: T2 fat-sat · sagittal · 4.0mm · 0.55mm/px · 6 of 17 slices shown (2 of 2)]
[im 1/17]
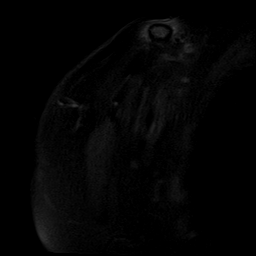
[im 4/17]
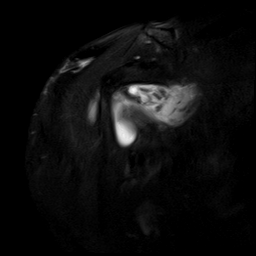
[im 7/17]
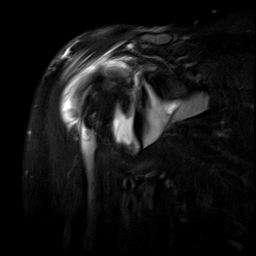
[im 10/17]
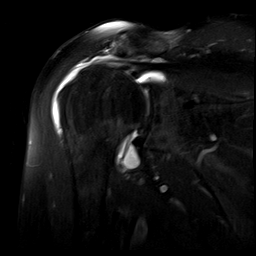
[im 13/17]
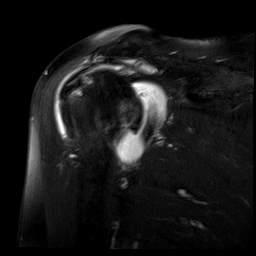
[im 17/17]
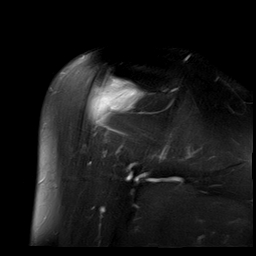

[Series 10: T1 fat-sat · sagittal · 4.0mm · 0.59mm/px · 7 of 18 slices shown (4 of 4)]
[im 1/18]
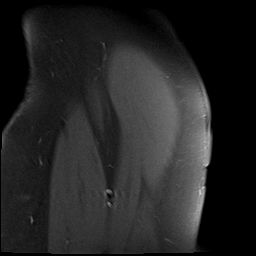
[im 3/18]
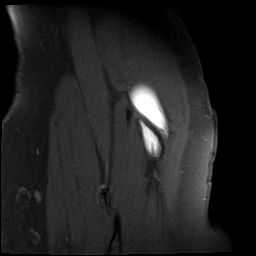
[im 6/18]
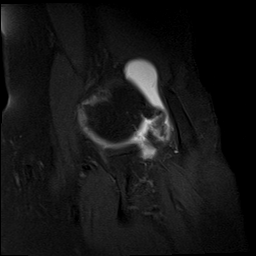
[im 9/18]
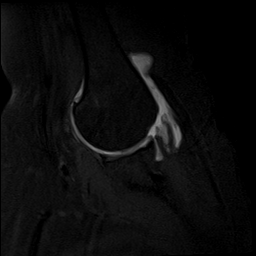
[im 12/18]
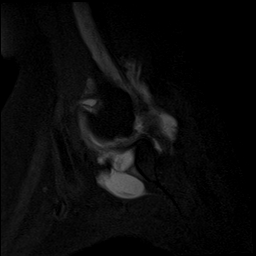
[im 15/18]
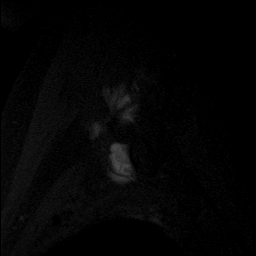
[im 18/18]
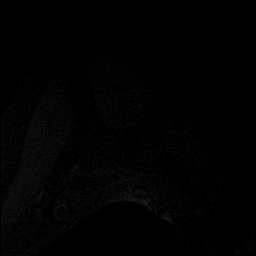

[40 of 40 positions shown; findings below may reference images not displayed]

FINDINGS: Rotator cuff: There is a complete full-thickness tear of the
supraspinatus tendon from the insertion site with approximately
cm of tendon retraction. There is also a focal full-thickness tear
of the superior subscapularis tendon measuring approximately 1.7 cm
from the tendon insertion site. There is partial interstitial tear
which extends into the myotendinous junction throughout the
remainder of the subscapularis tendon. The inferior portion of the
subscapularis however is intact. There is high-grade (greater than
50%) partial articular surface tear of the infraspinatus tendon
extending from the rotator cuff insertion measuring approximately 3
cm in transverse dimension. There is resultant fluid in the
subacromial-subdeltoid bursa and a slightly high-riding humeral
head. The teres minor tendon is intact. The muscles of the rotator
cuff are normal without tear, edema, or atrophy.

Muscles: The muscles other than the rotator cuff are normal without
tear, edema, or atrophy.

Biceps Long Head: There is nonvisualization of the intra-articular
and extra-articular portions of the long head of the biceps tendon,
likely due to a tear at the biceps labral complex and retraction
distally. There is fluid seen within the bicipital groove.

Acromioclavicular Joint: Moderate AC joint arthrosis seen with joint
space loss and capsular hypertrophy. Type II acromion.

Glenohumeral Joint: There is a slightly high-riding humeral head.
Moderate glenohumeral joint osteoarthritis is seen with chondral
fissuring. Small marginal osteophytes seen at the inferior portion
of the humeral head. There is heterogeneous fluid within the
glenohumeral joint with scattered debris.

Labrum: There is diffuse labral degeneration seen. No displaced
labral fragment is noted.

Bones: No fracture, osteonecrosis, or pathologic marrow
infiltration.

Other: Fluid is seen within the subacromial-subdeltoid bursa.
IMPRESSION: 1. Complete full-thickness tear of the supraspinatus tendon with
cm of tendon retraction from the insertion site. There is resultant
high-riding humeral head and fluid in the subacromial-subdeltoid
bursa.
2. Focal full-thickness tear of the superior subscapularis tendon
with partial interstitial tear throughout the remainder.
3. High-grade partial articular surface tear of the infraspinatus
tendon.
4.  glenohumeral joint effusion with synovitis
5. Moderate osteoarthritis with diffuse labral degeneration.

## 2023-05-06 ENCOUNTER — Encounter: Payer: Self-pay | Admitting: Physician Assistant

## 2023-05-06 ENCOUNTER — Ambulatory Visit (INDEPENDENT_AMBULATORY_CARE_PROVIDER_SITE_OTHER): Payer: Medicare Other

## 2023-05-06 ENCOUNTER — Ambulatory Visit (INDEPENDENT_AMBULATORY_CARE_PROVIDER_SITE_OTHER): Payer: Medicare Other | Admitting: Physician Assistant

## 2023-05-06 VITALS — Ht 67.0 in | Wt 138.0 lb

## 2023-05-06 DIAGNOSIS — M7061 Trochanteric bursitis, right hip: Secondary | ICD-10-CM

## 2023-05-06 NOTE — Progress Notes (Signed)
Office Visit Note   Patient: Lisa [name]           Date of Birth: Dec 03, 1946           MRN: [id number] Visit Date: 05/06/2023              Requested by: No referring provider defined for this encounter. PCP: Patient, No Pcp Per   Assessment & Plan: Visit Diagnoses:  1. Trochanteric bursitis of right hip     Plan: She shown IT band stretching exercises.  She will follow-up with [place] pain persist or becomes worse.  Questions were encouraged and answered at length  Follow-Up Instructions: Return if symptoms worsen or fail to improve.   Orders:  Orders Placed This Encounter  Procedures   XR HIP UNILAT W OR W/O PELVIS 2-3 VIEWS RIGHT   No orders of the defined types were placed in this encounter.     Procedures: No procedures performed   Clinical Data: No additional findings.   Subjective: Chief Complaint  Patient presents with   Right Hip - Pain    HPI patient 77 year old female with a history of bilateral total hip arthroplasties.  She states that her right hip has been bothering her since last fall when she had a mechanical fall.  She is having difficulty going up and down stairs.  No groin pain pain.  Pain is lateral aspect of the right hip only.  She notes crunching like sensation when going up and down stairs.  She takes 3 Aleve to go to sleep at night due to the pain.  Review of Systems  Constitutional:  Negative for chills and fever.     Objective: Vital Signs: Ht 5\' 7"  (1.702 m)   Wt 138 lb (62.6 kg)   BMI 21.61 kg/m   Physical Exam Constitutional:      Appearance: She is not ill-appearing or diaphoretic.  Pulmonary:     Effort: Pulmonary effort is normal.  Neurological:     Mental Status: She is alert and oriented to person, place, and time.  Psychiatric:        Mood and Affect: Mood normal.     Ortho Exam Bilateral hips good range of motion of both hips.  Discomfort with external rotation of the right hip only.  Tenderness over the right  hip trochanteric region. Specialty Comments:  No specialty comments available.  Imaging: XR HIP UNILAT W OR W/O PELVIS 2-3 VIEWS RIGHT  Result Date: 05/06/2023 AP pelvis lateral view of the right hip: Status post bilateral total hip arthroplasties.  Arthroplasty components well-seated.  No acute findings or fractures.     PMFS History: Patient Active Problem List   Diagnosis Date Noted   Status post total replacement of right hip 10/04/2017   Unilateral primary osteoarthritis, right hip 08/12/2017   Past Medical History:  Diagnosis Date   Arthritis    Osteoarthritis   Hypertension     History reviewed. No pertinent family history.  Past Surgical History:  Procedure Laterality Date   ABDOMINAL HYSTERECTOMY     APPENDECTOMY     JOINT REPLACEMENT  2011   Hip    TONSILLECTOMY     TOTAL HIP ARTHROPLASTY Right 10/04/2017   Procedure: RIGHT TOTAL HIP ARTHROPLASTY ANTERIOR APPROACH;  Surgeon: Kathryne Hitch, MD;  Location: [facility];  Service: Orthopedics;  Laterality: Right;   Social History   Occupational History   Not on file  Tobacco Use   Smoking status: Never  Smokeless tobacco: Never  Substance and Sexual Activity   Alcohol use: Yes    Comment: occ   Drug use: No   Sexual activity: Not on file
